# Patient Record
Sex: Male | Born: 2000 | Race: White | Hispanic: No | Marital: Single | Smoking: Never smoker
Health system: Southern US, Community
[De-identification: ages and names within clinical notes are randomized; demographics above are authoritative.]

## PROBLEM LIST (undated history)

## (undated) HISTORY — PX: TOOTH EXTRACTION: SUR596

---
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
## 2020-05-03 ENCOUNTER — Other Ambulatory Visit: Payer: Self-pay | Admitting: Internal Medicine

## 2020-05-04 ENCOUNTER — Other Ambulatory Visit: Payer: Self-pay

## 2020-05-04 ENCOUNTER — Encounter: Payer: Self-pay | Admitting: Internal Medicine

## 2020-05-04 ENCOUNTER — Ambulatory Visit (INDEPENDENT_AMBULATORY_CARE_PROVIDER_SITE_OTHER): Payer: No Typology Code available for payment source | Admitting: Internal Medicine

## 2020-05-04 VITALS — BP 118/62 | HR 107 | Temp 98.2°F | Ht 73.0 in | Wt 151.0 lb

## 2020-05-04 DIAGNOSIS — F321 Major depressive disorder, single episode, moderate: Secondary | ICD-10-CM | POA: Diagnosis not present

## 2020-05-04 DIAGNOSIS — J3089 Other allergic rhinitis: Secondary | ICD-10-CM | POA: Diagnosis not present

## 2020-05-04 DIAGNOSIS — Z9189 Other specified personal risk factors, not elsewhere classified: Secondary | ICD-10-CM

## 2020-05-04 DIAGNOSIS — R259 Unspecified abnormal involuntary movements: Secondary | ICD-10-CM | POA: Diagnosis not present

## 2020-05-04 DIAGNOSIS — R112 Nausea with vomiting, unspecified: Secondary | ICD-10-CM

## 2020-05-04 DIAGNOSIS — F1290 Cannabis use, unspecified, uncomplicated: Secondary | ICD-10-CM

## 2020-05-04 DIAGNOSIS — G43009 Migraine without aura, not intractable, without status migrainosus: Secondary | ICD-10-CM

## 2020-05-04 NOTE — Progress Notes (Signed)
Date:  05/04/2020   Name:  Kent Burke   DOB:  2001-07-09   MRN:  [id number]   Chief Complaint: Establish Care, Anxiety (Wants discuss anxiety and depression. Wants to speak to a councilor along with possibly being prescribed something. Wants referral. ), Muscle Atrophy (Been going on since about 5th grade.- on and off. Has involentary movements of his arms and legs. Wants to know if it could be Paroxsymal Kinesigenic Choreoathetosis. ), and Nausea (X 2 months- waking up feeling nauseous. Food made him feel like vomited. X 3 weeks ago started waking up and vomiting. Nauseous until noon. Thinks after doing some research its coming from smoking marijuana. ) New patient to establish care and discuss anxiety and depression. Previously a patient at [facility]. Immunization History  Administered Date(s) Administered  . Dtap, Unspecified 05/13/2000, 07/20/2000, 09/16/2000, 06/21/2001, 08/20/2004  . Hepatitis B 2001-01[birth year]06, 09/16/2000, 12/16/2000  . HiB (PRP-OMP) 05/13/2000, 07/20/2000, 09/16/2000, 03/22/2001  . IPV 05/13/2000, 07/20/2000, 12/16/2000, 08/20/2004  . MMR 08/20/2000, 06/21/2001  . Meningococcal B, OMV 07/28/2018, 01/07/2019  . Meningococcal Conjugate 07/28/2018  . Pneumococcal-Unspecified 05/13/2000, 07/20/2000, 09/16/2000  . Tdap 04/03/2011, 07/28/2018   Movement disorder - pt relates about 5 years of symptoms that he describes as involuntary movement of his arms or legs, sometimes his neck.  The movements can sometimes be stopped by clenching his fists. They often occur at the onset of intended movement such as walking.  He is very conscious of these and they cause significant embarrassment.  He was seen at student health last year and neurology consult was recommended but never done.  He was at [facility] at the time.  Depression        Associated symptoms include decreased concentration, hopelessness, insomnia, restlessness, decreased interest, appetite change and headaches.   Associated symptoms include no fatigue and no suicidal ideas.  Past medical history includes anxiety.   Anxiety Presents for initial visit. Symptoms include decreased concentration, depressed mood, excessive worry, insomnia, nausea, nervous/anxious behavior and restlessness. Patient reports no chest pain, palpitations, shortness of breath or suicidal ideas. Symptoms occur most days.    Abdominal Cramping This is a new problem. The problem occurs intermittently. The pain is located in the epigastric region. The pain is mild. Associated symptoms include headaches and nausea. Pertinent negatives include no constipation, fever or vomiting. Exacerbated by: he believes it is made worse by his recent increased use of marijuana.   Risk for HIV - pt states that he is in a same sex relationship with the same partner for the past 6 months.  He uses condoms inconsistently.  He has been tested for HIV in the recent past.  He believes that his partner is monogamous.  He wonders if he should consider PrEP.    Review of Systems  Constitutional: Positive for appetite change and unexpected weight change (about 10 lbs). Negative for fatigue and fever.  Respiratory: Negative for cough, chest tightness, shortness of breath and wheezing.   Cardiovascular: Negative for chest pain and palpitations.  Gastrointestinal: Positive for nausea. Negative for blood in stool, constipation and vomiting.  Skin: Negative for rash.  Allergic/Immunologic: Positive for environmental allergies.  Neurological: Positive for tremors (movement disorder) and headaches. Negative for weakness and numbness.  Psychiatric/Behavioral: Positive for decreased concentration, depression and sleep disturbance. Negative for suicidal ideas. The patient is nervous/anxious and has insomnia.     There are no problems to display for this patient.   No Known Allergies  Past Surgical History:  Procedure Laterality Date  . TOOTH EXTRACTION      Childhood- 1st grade    Social History   Tobacco Use  . Smoking status: Never Smoker  . Smokeless tobacco: Never Used  Substance Use Topics  . Alcohol use: Yes    Comment: Rare   . Drug use: Yes    Types: Marijuana     Medication list has been reviewed and updated.  Current Meds  Medication Sig  . loratadine (CLARITIN) 10 MG tablet Take by mouth.  . Multiple Vitamin (MULTIVITAMIN) capsule Take 1 capsule by mouth daily.  . naproxen (NAPROSYN) 500 MG tablet Take 1 tablet (500 mg total) by mouth 2 (two) times daily. At onset of migraine    PHQ 2/9 Scores 05/04/2020  PHQ - 2 Score 4  PHQ- 9 Score 18   GAD 7 : Generalized Anxiety Score 05/04/2020  Nervous, Anxious, on Edge 3  Control/stop worrying 3  Worry too much - different things 3  Trouble relaxing 3  Restless 3  Easily annoyed or irritable 3  Afraid - awful might happen 3  Total GAD 7 Score 21  Anxiety Difficulty Very difficult    BP Readings from Last 3 Encounters:  05/04/20 118/62  09/29/17 125/68 (68 %, Z = 0.48 /  40 %, Z = -0.26)*   *BP percentiles are based on the 2017 AAP Clinical Practice Guideline for boys    Physical Exam Vitals and nursing note reviewed.  Constitutional:      General: He is not in acute distress.    Appearance: Normal appearance. He is well-developed.  HENT:     Head: Normocephalic and atraumatic.  Cardiovascular:     Rate and Rhythm: Normal rate and regular rhythm.     Pulses: Normal pulses.     Heart sounds: No murmur.  Pulmonary:     Effort: Pulmonary effort is normal. No respiratory distress.     Breath sounds: No wheezing or rhonchi.  Abdominal:     General: Abdomen is flat.     Palpations: Abdomen is soft.     Tenderness: There is no abdominal tenderness.  Musculoskeletal:        General: Normal range of motion.     Cervical back: Normal range of motion.     Right lower leg: No edema.     Left lower leg: No edema.  Lymphadenopathy:     Cervical: No cervical  adenopathy.  Skin:    General: Skin is warm and dry.     Capillary Refill: Capillary refill takes less than 2 seconds.     Findings: No rash.  Neurological:     General: No focal deficit present.     Mental Status: He is alert and oriented to person, place, and time.     Sensory: Sensation is intact.     Motor: Motor function is intact.     Coordination: Coordination is intact.     Gait: Gait is intact.     Deep Tendon Reflexes:     Reflex Scores:      Bicep reflexes are 1+ on the right side and 1+ on the left side.      Patellar reflexes are 2+ on the right side and 2+ on the left side. Psychiatric:        Behavior: Behavior normal.        Thought Content: Thought content normal.     Wt Readings from Last 3 Encounters:  05/04/20 151 lb (68.5 kg)  09/29/17 140 lb (63.5 kg) (40 %, Z= -0.25)*   * Growth percentiles are based on CDC (Boys, 2-20 Years) data.    BP 118/62   Pulse (!) 107   Temp 98.2 F (36.8 C) (Temporal)   Ht 6' 1"  (1.854 m)   Wt 151 lb (68.5 kg)   SpO2 97%   BMI 19.92 kg/m   Assessment and Plan: 1. Abnormal involuntary movements None were observed today despite patient's effort to trigger them. His neuro exam is non focal. Will refer to Neurology for further investigation - Ambulatory referral to Neurology  2. Major depressive disorder, single episode, moderate degree ([place]) He is suffering from significant stress and anxiety - largely related to the past year of school difficulty, social isolation, etc Recommend several Mental health providers in the area to pursue counseling and possibly treatment with medication.  3. Cannabinoid hyperemesis syndrome Strongly urged him to cut back on marijuana use  4. Environmental and seasonal allergies Continue claritin as needed  5. Migraine without aura and without status migrainosus, not intractable These are intermittent and respond well to Naproxen.  6. At risk for HIV due to homosexual contact Pt is  counseled regarding routine use of condoms and routine HIV testing. He does not appear to be at high risk at this time - if his situation changes, he would be a candidate for PrEP but would need to be referred to ID.  His is not interested in HIV testing today.   Partially dictated using Editor, commissioning. Any errors are unintentional.  Halina Maidens, MD [facility]  05/04/2020

## 2020-05-04 NOTE — Patient Instructions (Signed)
[facility]: [phone]  [facility]: [phone]  [facility]: [phone]

## 2021-11-06 ENCOUNTER — Ambulatory Visit (INDEPENDENT_AMBULATORY_CARE_PROVIDER_SITE_OTHER): Payer: No Typology Code available for payment source | Admitting: Internal Medicine

## 2021-11-06 ENCOUNTER — Encounter: Payer: Self-pay | Admitting: Internal Medicine

## 2021-11-06 ENCOUNTER — Other Ambulatory Visit: Payer: Self-pay

## 2021-11-06 VITALS — BP 128/78 | HR 76 | Ht 73.0 in | Wt 144.6 lb

## 2021-11-06 DIAGNOSIS — F321 Major depressive disorder, single episode, moderate: Secondary | ICD-10-CM

## 2021-11-06 DIAGNOSIS — M76891 Other specified enthesopathies of right lower limb, excluding foot: Secondary | ICD-10-CM

## 2021-11-06 DIAGNOSIS — Z23 Encounter for immunization: Secondary | ICD-10-CM

## 2021-11-06 DIAGNOSIS — F411 Generalized anxiety disorder: Secondary | ICD-10-CM | POA: Diagnosis not present

## 2021-11-06 NOTE — Progress Notes (Signed)
Date:  11/06/2021   Name:  Kent Burke   DOB:  2001/03/08   MRN:  [id number]   Chief Complaint: Knee Pain  Knee Pain  The incident occurred more than 1 week ago. There was no injury mechanism. The pain is present in the right knee. The pain is at a severity of 4/10. The pain is mild. The pain has been Intermittent since onset. Pertinent negatives include no numbness. Associated symptoms comments: Difficulty extending leg in full, and bending down.  Anxiety Presents for follow-up visit. Symptoms include depressed mood, excessive worry, irritability and nervous/anxious behavior. Symptoms occur most days.   Compliance with medications is 76-100% (prescribed buspar then sertraline by neurology for anxiety felt to be contributing to his muscle spasms.  However, little benefit.).   No results found for: CREATININE, BUN, NA, K, CL, CO2 No results found for: CHOL, HDL, LDLCALC, LDLDIRECT, TRIG, CHOLHDL No results found for: TSH No results found for: HGBA1C No results found for: WBC, HGB, HCT, MCV, PLT No results found for: ALT, AST, GGT, ALKPHOS, BILITOT   Review of Systems  Constitutional:  Positive for irritability. Negative for chills and fever.  Musculoskeletal:  Positive for arthralgias. Negative for gait problem, joint swelling and myalgias.  Skin:  Negative for rash.  Neurological:  Negative for weakness and numbness.  Psychiatric/Behavioral:  The patient is nervous/anxious.    Patient Active Problem List   Diagnosis Date Noted   Abnormal involuntary movements 05/04/2020   Major depressive disorder, single episode, moderate degree (HCC) 05/04/2020   Cannabinoid hyperemesis syndrome 05/04/2020   Environmental and seasonal allergies 05/04/2020   Migraine without aura and without status migrainosus, not intractable 05/04/2020   At risk for HIV due to homosexual contact 05/04/2020    No Known Allergies  Past Surgical History:  Procedure Laterality Date   TOOTH EXTRACTION      Childhood- 1st grade    Social History   Tobacco Use   Smoking status: Never   Smokeless tobacco: Never  Vaping Use   Vaping Use: Former   Substances: Nicotine, Flavoring  Substance Use Topics   Alcohol use: Yes    Comment: Rare    Drug use: Yes    Types: Marijuana     Medication list has been reviewed and updated.  Current Meds  Medication Sig   carbamazepine (TEGRETOL) 200 MG tablet Take 1.5 tablets by mouth daily.   loratadine (CLARITIN) 10 MG tablet Take by mouth.   Multiple Vitamin (MULTIVITAMIN) capsule Take 1 capsule by mouth daily.   naproxen (NAPROSYN) 500 MG tablet Take 1 tablet (500 mg total) by mouth 2 (two) times daily. At onset of migraine   sertraline (ZOLOFT) 50 MG tablet Take 50 mg by mouth daily.    PHQ 2/9 Scores 11/06/2021 05/04/2020  PHQ - 2 Score 5 4  PHQ- 9 Score 13 18    GAD 7 : Generalized Anxiety Score 11/06/2021 05/04/2020  Nervous, Anxious, on Edge 3 3  Control/stop worrying 3 3  Worry too much - different things 3 3  Trouble relaxing 2 3  Restless 1 3  Easily annoyed or irritable 3 3  Afraid - awful might happen 2 3  Total GAD 7 Score 17 21  Anxiety Difficulty Very difficult Very difficult    BP Readings from Last 3 Encounters:  11/06/21 128/78  05/04/20 118/62  09/29/17 125/68 (71 %, Z = 0.55 /  43 %, Z = -0.18)*   *BP percentiles are based on the  2017 AAP Clinical Practice Guideline for boys    Physical Exam Vitals and nursing note reviewed.  Constitutional:      General: He is not in acute distress.    Appearance: He is well-developed.  HENT:     Head: Normocephalic and atraumatic.  Cardiovascular:     Rate and Rhythm: Normal rate and regular rhythm.  Pulmonary:     Effort: Pulmonary effort is normal. No respiratory distress.     Breath sounds: No wheezing or rhonchi.  Musculoskeletal:        General: Normal range of motion.     Cervical back: Normal range of motion.     Right knee: No swelling, effusion, erythema or  bony tenderness. Normal range of motion (mild discomfort anteriorly with extremes of flexion). No tenderness. No MCL laxity or ACL laxity.     Instability Tests: Anterior drawer test negative.     Left knee: Normal.  Lymphadenopathy:     Cervical: No cervical adenopathy.  Skin:    General: Skin is warm and dry.     Findings: No rash.  Neurological:     Mental Status: He is alert and oriented to person, place, and time.  Psychiatric:        Mood and Affect: Mood normal.        Behavior: Behavior normal.    Wt Readings from Last 3 Encounters:  11/06/21 144 lb 9.6 oz (65.6 kg)  05/04/20 151 lb (68.5 kg)  09/29/17 140 lb (63.5 kg) (40 %, Z= -0.25)*   * Growth percentiles are based on CDC (Boys, 2-20 Years) data.    BP 128/78   Pulse 76   Ht 6\' 1"  (1.854 m)   Wt 144 lb 9.6 oz (65.6 kg)   SpO2 97%   BMI 19.08 kg/m   Assessment and Plan: 1. Tendonitis of knee, right Recommend avoiding extremes of ROM such as squatting Aleve 2 tabs bid If worsening or sx change would see SM  2. Generalized anxiety disorder On Sertraline from Neurology He should consider establishing care with a mental health provider for improved sx control He has evidence of depressive sx as well that are not responding well to Sertraline.  3. Need for immunization against influenza - Flu Vaccine QUAD 62mo+IM (Fluarix, Fluzone & Alfiuria Quad PF)   Partially dictated using 5mo. Any errors are unintentional.  Animal nutritionist, MD [facility]  11/06/2021

## 2021-11-06 NOTE — Patient Instructions (Signed)
Aleve 2 tabs twice a day

## 2023-01-22 ENCOUNTER — Encounter: Payer: No Typology Code available for payment source | Admitting: Internal Medicine

## 2023-02-13 ENCOUNTER — Encounter: Payer: Self-pay | Admitting: Internal Medicine

## 2023-02-13 ENCOUNTER — Ambulatory Visit (INDEPENDENT_AMBULATORY_CARE_PROVIDER_SITE_OTHER): Payer: BC Managed Care – PPO | Admitting: Internal Medicine

## 2023-02-13 VITALS — BP 126/76 | HR 81 | Ht 73.0 in | Wt 153.0 lb

## 2023-02-13 DIAGNOSIS — Z0000 Encounter for general adult medical examination without abnormal findings: Secondary | ICD-10-CM | POA: Diagnosis not present

## 2023-02-13 DIAGNOSIS — Z13220 Encounter for screening for lipoid disorders: Secondary | ICD-10-CM

## 2023-02-13 DIAGNOSIS — Z79899 Other long term (current) drug therapy: Secondary | ICD-10-CM | POA: Diagnosis not present

## 2023-02-13 DIAGNOSIS — Z1159 Encounter for screening for other viral diseases: Secondary | ICD-10-CM

## 2023-02-13 DIAGNOSIS — R259 Unspecified abnormal involuntary movements: Secondary | ICD-10-CM

## 2023-02-13 DIAGNOSIS — Z114 Encounter for screening for human immunodeficiency virus [HIV]: Secondary | ICD-10-CM

## 2023-02-13 DIAGNOSIS — F321 Major depressive disorder, single episode, moderate: Secondary | ICD-10-CM

## 2023-02-13 MED ORDER — BUPROPION HCL ER (XL) 150 MG PO TB24: 150.0000 mg | ORAL_TABLET | Freq: Two times a day (BID) | ORAL | 3 refills | Status: DC

## 2023-02-13 NOTE — Assessment & Plan Note (Addendum)
Followed by Neurology On Carbamazepine;  Has also tried zoloft, bupropion, Lexapro

## 2023-02-13 NOTE — Progress Notes (Signed)
Date:  02/13/2023   Name:  Kent Burke   DOB:  2001-03-07   MRN:  [id number]   Chief Complaint: Annual Exam Kent Burke is a 22 y.o. male who presents today for his Complete Annual Exam. He feels poorly. He reports exercising none. He reports he is sleeping poorly. Working at [employer] and [employer]. Sexually active with one male partner exclusively for the past three years.  No STI concerns.  Has not had Guardasil vaccine.  Colonoscopy: none  Immunization History  Administered Date(s) Administered   Dtap, Unspecified 05/13/2000, 07/20/2000, 09/16/2000, 06/21/2001, 08/20/2004   HIB (PRP-OMP) 05/13/2000, 07/20/2000, 09/16/2000, 03/22/2001   Hepatitis B 06/08/00, 09/16/2000, 12/16/2000   IPV 05/13/2000, 07/20/2000, 12/16/2000, 08/20/2004   Influenza,inj,Quad PF,6+ Mos 11/06/2021   MMR 08/20/2000, 06/21/2001   Meningococcal B, OMV 07/28/2018, 01/07/2019   Meningococcal Conjugate 07/28/2018   Pneumococcal-Unspecified 05/13/2000, 07/20/2000, 09/16/2000   Tdap 04/03/2011, 07/28/2018   Health Maintenance Due  Topic Date Due   COVID-19 Vaccine (1) Never done   HPV VACCINES (1 - Male 2-dose series) Never done   Hepatitis C Screening  Never done    No results found for: "PSA1", "PSA"  HPI 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  On Tegretol.  Symptoms are well controlled. Anxiety - also being treated by Neurology.  Previously on Zoloft,  Bupropion and Lexapro.  He stopped all the medications due to lack of benefit and no refills.  He thinks the bupropion might have been helpful at a higher dose.  No results found for: "NA", "K", "CO2", "GLUCOSE", "BUN", "CREATININE", "CALCIUM", "EGFR", "GFRNONAA" No results found for: "CHOL", "HDL", "LDLCALC", "LDLDIRECT", "TRIG", "CHOLHDL" No results found for: "TSH" No results found for: "HGBA1C" No results found for: "WBC", "HGB", "HCT", "MCV", "PLT" No results found for: "ALT", "AST", "GGT", "ALKPHOS", "BILITOT" No results found for:  "25OHVITD2", "25OHVITD3", "VD25OH"   Review of Systems  Constitutional:  Negative for appetite change, chills, diaphoresis, fatigue and unexpected weight change.  HENT:  Negative for hearing loss, tinnitus, trouble swallowing and voice change.   Eyes:  Negative for visual disturbance.  Respiratory:  Negative for choking, shortness of breath and wheezing.   Cardiovascular:  Negative for chest pain, palpitations and leg swelling.  Gastrointestinal:  Negative for abdominal pain, blood in stool, constipation and diarrhea.  Genitourinary:  Negative for difficulty urinating, dysuria and frequency.  Musculoskeletal:  Positive for arthralgias (intermittent knee pain). Negative for back pain and myalgias.  Skin:  Negative for color change and rash.  Neurological:  Negative for dizziness, seizures, syncope and headaches.  Hematological:  Negative for adenopathy.  Psychiatric/Behavioral:  Positive for dysphoric mood and sleep disturbance. The patient is nervous/anxious.     Patient Active Problem List   Diagnosis Date Noted   Abnormal involuntary movements 05/04/2020   Major depressive disorder, single episode, moderate degree ([place]) 05/04/2020   Cannabinoid hyperemesis syndrome 05/04/2020   Environmental and seasonal allergies 05/04/2020   Migraine without aura and without status migrainosus, not intractable 05/04/2020   At risk for HIV due to homosexual contact 05/04/2020    No Known Allergies  Past Surgical History:  Procedure Laterality Date   TOOTH EXTRACTION     Childhood- 1st grade    Social History   Tobacco Use   Smoking status: Never   Smokeless tobacco: Never  Vaping Use   Vaping Use: Former   Substances: Nicotine, Flavoring  Substance Use Topics   Alcohol use: Yes    Comment: Rare  Drug use: Yes    Types: Marijuana     Medication list has been reviewed and updated.  Current Meds  Medication Sig   carbamazepine (TEGRETOL) 200 MG tablet Take 1.5 tablets by mouth  daily.   loratadine (CLARITIN) 10 MG tablet Take by mouth.   Multiple Vitamin (MULTIVITAMIN) capsule Take 1 capsule by mouth daily.   naproxen (NAPROSYN) 500 MG tablet Take 1 tablet (500 mg total) by mouth 2 (two) times daily. At onset of migraine   [DISCONTINUED] buPROPion (WELLBUTRIN XL) 150 MG 24 hr tablet Take 150 mg by mouth daily.   [DISCONTINUED] escitalopram (LEXAPRO) 20 MG tablet Take 20 mg by mouth daily.       02/13/2023    9:20 AM 11/06/2021    9:53 AM 05/04/2020    3:38 PM  GAD 7 : Generalized Anxiety Score  Nervous, Anxious, on Edge 3 3 3  $ Control/stop worrying 3 3 3  $ Worry too much - different things 3 3 3  $ Trouble relaxing 2 2 3  $ Restless 1 1 3  $ Easily annoyed or irritable 3 3 3  $ Afraid - awful might happen 3 2 3  $ Total GAD 7 Score 18 17 21  $ Anxiety Difficulty Somewhat difficult Very difficult Very difficult       02/13/2023    9:20 AM 11/06/2021    9:53 AM 05/04/2020    3:37 PM  Depression screen PHQ 2/9  Decreased Interest 1 3 2  $ Down, Depressed, Hopeless 2 2 2  $ PHQ - 2 Score 3 5 4  $ Altered sleeping 3 2 2  $ Tired, decreased energy 3 2 3  $ Change in appetite 2 1 3  $ Feeling bad or failure about yourself  1 2 1  $ Trouble concentrating 1 1 2  $ Moving slowly or fidgety/restless 1 0 3  Suicidal thoughts 0 0 0  PHQ-9 Score 14 13 18  $ Difficult doing work/chores Somewhat difficult Not difficult at all Very difficult    BP Readings from Last 3 Encounters:  02/13/23 126/76  11/06/21 128/78  05/04/20 118/62    Physical Exam Vitals and nursing note reviewed.  Constitutional:      Appearance: Normal appearance. He is well-developed.  HENT:     Head: Normocephalic.     Right Ear: Tympanic membrane, ear canal and external ear normal.     Left Ear: Tympanic membrane, ear canal and external ear normal.     Nose: Nose normal.  Eyes:     Conjunctiva/sclera: Conjunctivae normal.     Pupils: Pupils are equal, round, and reactive to light.  Neck:     Thyroid: No  thyromegaly.     Vascular: No carotid bruit.  Cardiovascular:     Rate and Rhythm: Normal rate and regular rhythm.     Heart sounds: Normal heart sounds.  Pulmonary:     Effort: Pulmonary effort is normal.     Breath sounds: Normal breath sounds. No wheezing.  Chest:  Breasts:    Right: No mass.     Left: No mass.  Abdominal:     General: Bowel sounds are normal.     Palpations: Abdomen is soft.     Tenderness: There is no abdominal tenderness.  Musculoskeletal:        General: No swelling, tenderness or deformity. Normal range of motion.     Cervical back: Normal range of motion and neck supple.     Right knee: No swelling, deformity, effusion or erythema.     Left knee: No  swelling, deformity, effusion or erythema.     Right lower leg: No edema.     Left lower leg: No edema.  Lymphadenopathy:     Cervical: No cervical adenopathy.  Skin:    General: Skin is warm and dry.     Capillary Refill: Capillary refill takes less than 2 seconds.  Neurological:     General: No focal deficit present.     Mental Status: He is alert and oriented to person, place, and time.     Deep Tendon Reflexes: Reflexes are normal and symmetric.  Psychiatric:        Attention and Perception: Attention normal.        Mood and Affect: Mood normal.        Thought Content: Thought content normal.     Wt Readings from Last 3 Encounters:  02/13/23 153 lb (69.4 kg)  11/06/21 144 lb 9.6 oz (65.6 kg)  05/04/20 151 lb (68.5 kg)    BP 126/76   Pulse 81   Ht 6' 1"$  (1.854 m)   Wt 153 lb (69.4 kg)   SpO2 96%   BMI 20.19 kg/m   Assessment and Plan: Problem List Items Addressed This Visit       Other   Abnormal involuntary movements    Followed by Neurology On Carbamazepine;  Has also tried zoloft, bupropion, Lexapro      Major depressive disorder, single episode, moderate degree (H[facility])    Previously on Lexapro and Bupropion but stopped last year Recommend findings a Psych or therapist - list  given Start Bupropion 150 mg - taper up to bid.       Relevant Medications   buPROPion (WELLBUTRIN XL) 150 MG 24 hr tablet   Other Visit Diagnoses     Annual physical exam    -  Primary   we discussed good sleep hygiene possible Gardasil vaccine More regular exercise outside of work   Relevant Orders   CBC with Differential/Platelet   Comprehensive metabolic panel   HIV Antibody (routine testing w rflx)   Lipid panel   Encounter for screening for HIV       Relevant Orders   HIV Antibody (routine testing w rflx)   Screening for lipid disorders       Relevant Orders   Lipid panel   Need for hepatitis C screening test       Relevant Orders   Hepatitis C antibody   Long term current use of therapeutic drug       Relevant Orders   CBC with Differential/Platelet   Comprehensive metabolic panel   Carbamazepine Level (Tegretol), total        Partially dictated using Editor, commissioning. Any errors are unintentional.  Halina Maidens, MD [facility]  02/13/2023

## 2023-02-13 NOTE — Assessment & Plan Note (Addendum)
Previously on Lexapro and Bupropion but stopped last year Recommend findings a Psych or therapist - list given Start Bupropion 150 mg - taper up to bid.

## 2023-02-14 LAB — CBC WITH DIFFERENTIAL/PLATELET
Basophils Absolute: 0.1 10*3/uL (ref 0.0–0.2)
Basos: 1 %
EOS (ABSOLUTE): 0.5 10*3/uL — ABNORMAL HIGH (ref 0.0–0.4)
Eos: 8 %
Hematocrit: 43.5 % (ref 37.5–51.0)
Hemoglobin: 14.5 g/dL (ref 13.0–17.7)
Immature Grans (Abs): 0 10*3/uL (ref 0.0–0.1)
Immature Granulocytes: 0 %
Lymphocytes Absolute: 2.2 10*3/uL (ref 0.7–3.1)
Lymphs: 36 %
MCH: 29.2 pg (ref 26.6–33.0)
MCHC: 33.3 g/dL (ref 31.5–35.7)
MCV: 88 fL (ref 79–97)
Monocytes Absolute: 0.4 10*3/uL (ref 0.1–0.9)
Monocytes: 7 %
Neutrophils Absolute: 2.9 10*3/uL (ref 1.4–7.0)
Neutrophils: 48 %
Platelets: 250 10*3/uL (ref 150–450)
RBC: 4.97 x10E6/uL (ref 4.14–5.80)
RDW: 12.4 % (ref 11.6–15.4)
WBC: 6 10*3/uL (ref 3.4–10.8)

## 2023-02-14 LAB — COMPREHENSIVE METABOLIC PANEL
ALT: 12 IU/L (ref 0–44)
AST: 19 IU/L (ref 0–40)
Albumin/Globulin Ratio: 1.9 (ref 1.2–2.2)
Albumin: 5 g/dL (ref 4.3–5.2)
Alkaline Phosphatase: 58 IU/L (ref 44–121)
BUN/Creatinine Ratio: 13 (ref 9–20)
BUN: 10 mg/dL (ref 6–20)
Bilirubin Total: 0.3 mg/dL (ref 0.0–1.2)
CO2: 23 mmol/L (ref 20–29)
Calcium: 10.1 mg/dL (ref 8.7–10.2)
Chloride: 102 mmol/L (ref 96–106)
Creatinine, Ser: 0.76 mg/dL (ref 0.76–1.27)
Globulin, Total: 2.6 g/dL (ref 1.5–4.5)
Glucose: 92 mg/dL (ref 70–99)
Potassium: 4.2 mmol/L (ref 3.5–5.2)
Sodium: 140 mmol/L (ref 134–144)
Total Protein: 7.6 g/dL (ref 6.0–8.5)
eGFR: 130 mL/min/{1.73_m2} (ref >59)

## 2023-02-14 LAB — LIPID PANEL
Chol/HDL Ratio: 2.7 ratio (ref 0.0–5.0)
Cholesterol, Total: 143 mg/dL (ref 100–199)
HDL: 53 mg/dL (ref >39)
LDL Chol Calc (NIH): 78 mg/dL (ref 0–99)
Triglycerides: 54 mg/dL (ref 0–149)
VLDL Cholesterol Cal: 12 mg/dL (ref 5–40)

## 2023-02-14 LAB — HIV ANTIBODY (ROUTINE TESTING W REFLEX): HIV Screen 4th Generation wRfx: NONREACTIVE

## 2023-02-14 LAB — HEPATITIS C ANTIBODY: Hep C Virus Ab: NONREACTIVE

## 2023-02-14 LAB — CARBAMAZEPINE LEVEL, TOTAL: Carbamazepine (Tegretol), S: 4.4 ug/mL (ref 4.0–12.0)

## 2023-02-16 NOTE — Progress Notes (Signed)
PC to pt discussed labs, voiced understanding.

## 2023-03-05 DIAGNOSIS — R253 Fasciculation: Secondary | ICD-10-CM | POA: Diagnosis not present

## 2023-03-05 DIAGNOSIS — F411 Generalized anxiety disorder: Secondary | ICD-10-CM | POA: Diagnosis not present

## 2023-03-05 DIAGNOSIS — M62838 Other muscle spasm: Secondary | ICD-10-CM | POA: Diagnosis not present

## 2023-03-05 DIAGNOSIS — R259 Unspecified abnormal involuntary movements: Secondary | ICD-10-CM | POA: Diagnosis not present

## 2023-06-29 ENCOUNTER — Other Ambulatory Visit: Payer: Self-pay | Admitting: Internal Medicine

## 2023-06-29 DIAGNOSIS — F321 Major depressive disorder, single episode, moderate: Secondary | ICD-10-CM

## 2023-08-02 ENCOUNTER — Ambulatory Visit
Admission: EM | Admit: 2023-08-02 | Discharge: 2023-08-02 | Disposition: A | Discharge status: Home / Self Care | Payer: BC Managed Care – PPO | Attending: Physician Assistant | Admitting: Physician Assistant

## 2023-08-02 ENCOUNTER — Ambulatory Visit (INDEPENDENT_AMBULATORY_CARE_PROVIDER_SITE_OTHER): Payer: BC Managed Care – PPO

## 2023-08-02 DIAGNOSIS — S93601A Unspecified sprain of right foot, initial encounter: Secondary | ICD-10-CM

## 2023-08-02 DIAGNOSIS — M79671 Pain in right foot: Secondary | ICD-10-CM

## 2023-08-02 NOTE — ED Triage Notes (Signed)
Pt here with C/O right foot pain, 2 weeks ago rolled foot hit top of foot, no swelling

## 2023-08-02 NOTE — ED Provider Notes (Signed)
[facility]    CSN: [id number] Arrival date & time: 08/02/23  0957      History   Chief Complaint Chief Complaint  Patient presents with   Foot Pain    HPI Kent Burke is a 22 y.o. male presenting for right dorsal foot pain x 2 to 3 weeks.  Patient reports he was wearing platform crocs a couple weeks ago and somehow twisted his foot/ankle.  He denies a fall but says that he caught himself as he was trying to fall.  He reports he did not think much of it but he is continue to have discomfort along the top of his foot since.  He denies any associated swelling.  He says he ices the area and has taken ibuprofen when he remembers it.  He says this has helped and symptoms have gotten better but they persist so he thought he should get checked out.  He reports increased pain with plantarflexion.  Pain gets a little better when he is walking.  No numbness or weakness.  No other complaints.  HPI  No past medical history on file.  Patient Active Problem List   Diagnosis Date Noted   Abnormal involuntary movements 05/04/2020   Major depressive disorder, single episode, moderate degree (HCC) 05/04/2020   Cannabinoid hyperemesis syndrome 05/04/2020   Environmental and seasonal allergies 05/04/2020   Migraine without aura and without status migrainosus, not intractable 05/04/2020   At risk for HIV due to homosexual contact 05/04/2020    Past Surgical History:  Procedure Laterality Date   TOOTH EXTRACTION     Childhood- 1st grade       Home Medications    Prior to Admission medications   Medication Sig Start Date End Date Taking? Authorizing Provider  buPROPion (WELLBUTRIN XL) 150 MG 24 hr tablet TAKE 1 TABLET(150 MG) BY MOUTH IN THE MORNING AND AT BEDTIME 06/30/23  Yes Reubin Milan, MD  carbamazepine (TEGRETOL) 200 MG tablet Take 1.5 tablets by mouth daily. 10/22/21  Yes [provider]  loratadine (CLARITIN) 10 MG tablet Take by mouth.   Yes [provider]  Multiple Vitamin (MULTIVITAMIN) capsule Take 1 capsule by mouth daily.   Yes [provider]  naproxen (NAPROSYN) 500 MG tablet Take 1 tablet (500 mg total) by mouth 2 (two) times daily. At onset of migraine 09/29/17   Lutricia Feil, PA-C    Family History Family History  Problem Relation Age of Onset   Migraines Mother    Healthy Father    Breast cancer Paternal Grandmother     Social History Social History   Tobacco Use   Smoking status: Never   Smokeless tobacco: Never  Vaping Use   Vaping status: Former   Substances: Nicotine, Flavoring  Substance Use Topics   Alcohol use: Yes    Comment: Rare    Drug use: Yes    Types: Marijuana     Allergies   Patient has no known allergies.   Review of Systems Review of Systems  Musculoskeletal:  Positive for arthralgias. Negative for gait problem and joint swelling.  Skin:  Negative for color change and wound.  Neurological:  Negative for weakness and numbness.     Physical Exam Triage Vital Signs ED Triage Vitals  Encounter Vitals Group     BP 08/02/23 1011 118/70     Systolic BP Percentile --      Diastolic BP Percentile --      Pulse Rate 08/02/23 1011  80     Resp 08/02/23 1011 16     Temp 08/02/23 1011 98.9 F (37.2 C)     Temp Source 08/02/23 1011 Oral     SpO2 08/02/23 1011 100 %     Weight 08/02/23 1009 145 lb (65.8 kg)     Height 08/02/23 1009 6\' 1"  (1.854 m)     Head Circumference --      Peak Flow --      Pain Score 08/02/23 1009 7     Pain Loc --      Pain Education --      Exclude from Growth Chart --    No data found.  Updated Vital Signs BP 118/70 (BP Location: Left Arm)   Pulse 80   Temp 98.9 F (37.2 C) (Oral)   Resp 16   Ht 6\' 1"  (1.854 m)   Wt 145 lb (65.8 kg)   SpO2 100%   BMI 19.13 kg/m     Physical Exam Vitals and nursing note reviewed.  Constitutional:      General: He is not in acute distress.    Appearance: He is well-developed.  HENT:      Head: Normocephalic and atraumatic.  Eyes:     General: No scleral icterus.    Conjunctiva/sclera: Conjunctivae normal.  Cardiovascular:     Rate and Rhythm: Normal rate and regular rhythm.     Pulses: Normal pulses.  Pulmonary:     Effort: Pulmonary effort is normal. No respiratory distress.  Musculoskeletal:     Cervical back: Neck supple.     Right ankle: No swelling. Tenderness present. No lateral malleolus or medial malleolus tenderness. Normal range of motion. Normal pulse.     Right foot: Normal range of motion. Tenderness (TTP 4th and 5th metatarsals, talus and navicular) present. No swelling. Normal pulse.  Skin:    General: Skin is warm and dry.     Capillary Refill: Capillary refill takes less than 2 seconds.  Neurological:     General: No focal deficit present.     Mental Status: He is alert. Mental status is at baseline.     Motor: No weakness.     Gait: Gait normal.  Psychiatric:        Mood and Affect: Mood normal.        Behavior: Behavior normal.      UC Treatments / Results  Labs (all labs ordered are listed, but only abnormal results are displayed) Labs Reviewed - No data to display  EKG   Radiology DG Foot Complete Right  Result Date: 08/02/2023 CLINICAL DATA:  Right foot pain. EXAM: RIGHT FOOT COMPLETE - 3+ VIEW COMPARISON:  None Available. FINDINGS: There is no evidence of fracture or dislocation. There is no evidence of arthropathy or other focal bone abnormality. Soft tissues are unremarkable. IMPRESSION: Negative. Electronically Signed   By: Kennith Center M.D.   On: 08/02/2023 11:10    Procedures Procedures (including critical care time)  Medications Ordered in UC Medications - No data to display  Initial Impression / Assessment and Plan / UC Course  I have reviewed the triage vital signs and the nursing notes.  Pertinent labs & imaging results that were available during my care of the patient were reviewed by me and considered in my medical  decision making (see chart for details).   22 year old male presents for right foot pain after twisting injury 2 to 3 weeks ago while wearing platform crocs.  X-ray of foot  obtained today.  Negative.  Right foot sprain.  Reviewed supportive care with RICE guidelines, NSAIDs.  Advised to be little more consistent with anti-inflammatory medication.  Advise no improvement in 2 weeks with follow-up with PCP or orthopedics.   Final Clinical Impressions(s) / UC Diagnoses   Final diagnoses:  Sprain of right foot, initial encounter  Foot pain, right     Discharge Instructions      SPRAIN: X-ray is negative. Stressed avoiding painful activities . Reviewed RICE guidelines. Use medications as directed, including NSAIDs. If no NSAIDs have been prescribed for you today, you may take Aleve or Motrin over the counter. May use Tylenol in between doses of NSAIDs.  If no improvement in the next 1-2 weeks, f/u with PCP or orthopedics.     ED Prescriptions   None    PDMP not reviewed this encounter.   Shirlee Latch, PA-C 08/02/23 1119

## 2023-08-02 NOTE — Discharge Instructions (Signed)
SPRAIN: X-ray is negative. Stressed avoiding painful activities . Reviewed RICE guidelines. Use medications as directed, including NSAIDs. If no NSAIDs have been prescribed for you today, you may take Aleve or Motrin over the counter. May use Tylenol in between doses of NSAIDs.  If no improvement in the next 1-2 weeks, f/u with PCP or orthopedics.

## 2023-09-30 DIAGNOSIS — M62838 Other muscle spasm: Secondary | ICD-10-CM | POA: Diagnosis not present

## 2023-09-30 DIAGNOSIS — F411 Generalized anxiety disorder: Secondary | ICD-10-CM | POA: Diagnosis not present

## 2023-09-30 DIAGNOSIS — R259 Unspecified abnormal involuntary movements: Secondary | ICD-10-CM | POA: Diagnosis not present

## 2023-09-30 DIAGNOSIS — R253 Fasciculation: Secondary | ICD-10-CM | POA: Diagnosis not present

## 2024-02-16 ENCOUNTER — Encounter: Payer: Self-pay | Admitting: Internal Medicine

## 2024-03-09 DIAGNOSIS — F4323 Adjustment disorder with mixed anxiety and depressed mood: Secondary | ICD-10-CM | POA: Diagnosis not present

## 2024-03-09 DIAGNOSIS — R259 Unspecified abnormal involuntary movements: Secondary | ICD-10-CM | POA: Diagnosis not present

## 2024-03-09 DIAGNOSIS — R253 Fasciculation: Secondary | ICD-10-CM | POA: Diagnosis not present

## 2024-05-18 ENCOUNTER — Encounter: Payer: BC Managed Care – PPO | Admitting: Internal Medicine

## 2024-09-24 ENCOUNTER — Ambulatory Visit
Admission: EM | Admit: 2024-09-24 | Discharge: 2024-09-24 | Disposition: A | Discharge status: Home / Self Care | Attending: Physician Assistant | Admitting: Physician Assistant

## 2024-09-24 DIAGNOSIS — M25511 Pain in right shoulder: Secondary | ICD-10-CM

## 2024-09-24 DIAGNOSIS — M549 Dorsalgia, unspecified: Secondary | ICD-10-CM

## 2024-09-24 DIAGNOSIS — M25512 Pain in left shoulder: Secondary | ICD-10-CM

## 2024-09-24 DIAGNOSIS — M542 Cervicalgia: Secondary | ICD-10-CM

## 2024-09-24 MED ORDER — BACLOFEN 10 MG PO TABS: 10.0000 mg | ORAL_TABLET | Freq: Three times a day (TID) | ORAL | 0 refills | Status: DC | PRN

## 2024-09-24 MED ORDER — NAPROXEN 500 MG PO TABS: 500.0000 mg | ORAL_TABLET | Freq: Two times a day (BID) | ORAL | 0 refills | Status: DC | PRN

## 2024-09-24 NOTE — ED Triage Notes (Signed)
 Pt c/o right side back pain x3days  Pt states that he has a bad habit of sleeping in bad positions, and is now having ongoing upper back pain that moves across the shoulder and to the right elbow.  Pt states that he went to get a massage, and it was overdone  Pt states that he has a new bed that is soft, and is a side sleeper.

## 2024-09-24 NOTE — ED Provider Notes (Signed)
 [facility]    CSN: [id number] Arrival date & time: 09/24/24  0806      History   Chief Complaint Chief Complaint  Patient presents with   Back Pain    HPI Hisashi Amadon is a 23 y.o. male presenting for bilateral upper back pain, thoracic spinal pain, bilateral shoulder pain and neck pain for the past 3 days.  Patient says it feels like he has pain all the way down to both of his elbows.  Denies weakness or numbness.  No injuries.  Patient states that he has to lift, reach and bend a lot at his job.  He states that his partner gave him a massage last night and he thinks it was too rough.  He has been taking ibuprofen as needed but says it has not really helped.  Patient also reports that he sleeps on his sides and thinks that has made things worse.  No other complaints.  HPI  History reviewed. No pertinent past medical history.  Patient Active Problem List   Diagnosis Date Noted   Abnormal involuntary movements 05/04/2020   Major depressive disorder, single episode, moderate degree (HCC) 05/04/2020   Cannabinoid hyperemesis syndrome 05/04/2020   Environmental and seasonal allergies 05/04/2020   Migraine without aura and without status migrainosus, not intractable 05/04/2020   At risk for HIV due to homosexual contact 05/04/2020    Past Surgical History:  Procedure Laterality Date   TOOTH EXTRACTION     Childhood- 1st grade       Home Medications    Prior to Admission medications   Medication Sig Start Date End Date Taking? Authorizing Provider  baclofen (LIORESAL) 10 MG tablet Take 1 tablet (10 mg total) by mouth 3 (three) times daily as needed for muscle spasms. 09/24/24  Yes Arvis Jolan NOVAK, PA-C  carbamazepine  (TEGRETOL ) 200 MG tablet Take 1.5 tablets by mouth daily. 10/22/21  Yes [provider]  loratadine (CLARITIN) 10 MG tablet Take by mouth.   Yes [provider]  Multiple Vitamin (MULTIVITAMIN) capsule Take 1 capsule by mouth  daily.   Yes [provider]  naproxen  (NAPROSYN ) 500 MG tablet Take 1 tablet (500 mg total) by mouth 2 (two) times daily as needed for moderate pain (pain score 4-6). 09/24/24  Yes Arvis Jolan B, PA-C  buPROPion  (WELLBUTRIN  XL) 150 MG 24 hr tablet TAKE 1 TABLET(150 MG) BY MOUTH IN THE MORNING AND AT BEDTIME 06/30/23   Justus Leita DEL, MD    Family History Family History  Problem Relation Age of Onset   Migraines Mother    Healthy Father    Breast cancer Paternal Grandmother     Social History Social History   Tobacco Use   Smoking status: Never   Smokeless tobacco: Never  Vaping Use   Vaping status: Former   Substances: Nicotine, Flavoring  Substance Use Topics   Alcohol use: Yes    Comment: Rare    Drug use: Yes    Types: Marijuana     Allergies   Patient has no known allergies.   Review of Systems Review of Systems  Musculoskeletal:  Positive for arthralgias, back pain, myalgias and neck pain. Negative for gait problem and joint swelling.  Skin:  Negative for color change, rash and wound.  Neurological:  Negative for weakness, numbness and headaches.     Physical Exam Triage Vital Signs ED Triage Vitals  Encounter Vitals Group     BP      Girls Systolic  BP Percentile      Girls Diastolic BP Percentile      Boys Systolic BP Percentile      Boys Diastolic BP Percentile      Pulse      Resp      Temp      Temp src      SpO2      Weight      Height      Head Circumference      Peak Flow      Pain Score      Pain Loc      Pain Education      Exclude from Growth Chart    No data found.  Updated Vital Signs BP 126/74 (BP Location: Left Arm)   Pulse 74   Temp 98.7 F (37.1 C) (Oral)   Resp 16   Wt 158 lb 9.6 oz (71.9 kg)   SpO2 97%   BMI 20.92 kg/m   Physical Exam Vitals and nursing note reviewed.  Constitutional:      General: He is not in acute distress.    Appearance: Normal appearance. He is well-developed. He is not  ill-appearing.  HENT:     Head: Normocephalic and atraumatic.  Eyes:     General: No scleral icterus.    Conjunctiva/sclera: Conjunctivae normal.  Cardiovascular:     Rate and Rhythm: Normal rate and regular rhythm.  Pulmonary:     Effort: Pulmonary effort is normal. No respiratory distress.     Breath sounds: Normal breath sounds.  Musculoskeletal:     Right shoulder: Tenderness (generalized) present. Normal range of motion.     Left shoulder: Tenderness (generalized) present. Normal range of motion.     Right elbow: Normal.     Left elbow: Normal.     Cervical back: Normal range of motion and neck supple. No tenderness. Pain with movement present. No spinous process tenderness or muscular tenderness.     Thoracic back: Normal range of motion.       Back:     Comments: TTP bilateral traps, along T-spine minimally, bilateral parathoracic muscles  Skin:    General: Skin is warm and dry.     Capillary Refill: Capillary refill takes less than 2 seconds.  Neurological:     General: No focal deficit present.     Mental Status: He is alert. Mental status is at baseline.     Motor: No weakness.     Gait: Gait normal.  Psychiatric:        Mood and Affect: Mood normal.        Behavior: Behavior normal.      UC Treatments / Results  Labs (all labs ordered are listed, but only abnormal results are displayed) Labs Reviewed - No data to display  EKG   Radiology No results found.  Procedures Procedures (including critical care time)  Medications Ordered in UC Medications - No data to display  Initial Impression / Assessment and Plan / UC Course  I have reviewed the triage vital signs and the nursing notes.  Pertinent labs & imaging results that were available during my care of the patient were reviewed by me and considered in my medical decision making (see chart for details).   23 year old male presents for pain of thoracic back, bilateral traps, bilateral shoulders and  neck for the past 3 days.  Symptoms worse after having massage by his partner.  Has been taking ibuprofen without relief.  Denies any  weakness or numbness of extremities.  Exam significant for generalized muscle tenderness of shoulders, traps and upper back.  Full range of motion.  Will treat at this time with naproxen  and baclofen.  Discussed stretches and reviewed attempting to open book exercise to help stretch.  He asked for a work note for today which was provided.  Reviewed return and ER precautions and advised PCP follow-up if symptoms ongoing.   Final Clinical Impressions(s) / UC Diagnoses   Final diagnoses:  Acute upper back pain  Acute pain of both shoulders  Neck pain     Discharge Instructions      -Try open book exercise (3 sets of 10-15 reps) 1-2 x daily  NECK PAIN: Stressed avoiding painful activities. This can exacerbate your symptoms and make them worse.  May apply heat to the areas of pain for some relief. Use medications as directed. Be aware of which medications make you drowsy and do not drive or operate any kind of heavy machinery while using the medication (ie pain medications or muscle relaxers). F/U with PCP for reexamination or return sooner if condition worsens or does not begin to improve over the next few days.   NECK PAIN RED FLAGS: If symptoms get worse than they are right now, you should come back sooner for re-evaluation. If you have increased numbness/ tingling or notice that the numbness/tingling is affecting the legs or saddle region, go to ER. If you ever lose continence go to ER.      BACK PAIN: Stressed avoiding painful activities . RICE (REST, ICE, COMPRESSION, ELEVATION) guidelines reviewed. May alternate ice and heat. Consider use of muscle rubs, Salonpas patches, etc. Use medications as directed including muscle relaxers if prescribed. Take anti-inflammatory medications as prescribed or OTC NSAIDs/Tylenol.  F/u with PCP in 7-10 days for reexamination,  and please feel free to call or return to the urgent care at any time for any questions or concerns you may have and we will be happy to help you!   BACK PAIN RED FLAGS: If the back pain acutely worsens or there are any red flag symptoms such as numbness/tingling, leg weakness, saddle anesthesia, or loss of bowel/bladder control, go immediately to the ER. Follow up with us  as scheduled or sooner if the pain does not begin to resolve or if it worsens before the follow up       ED Prescriptions     Medication Sig Dispense Auth. Provider   naproxen  (NAPROSYN ) 500 MG tablet Take 1 tablet (500 mg total) by mouth 2 (two) times daily as needed for moderate pain (pain score 4-6). 30 tablet Arvis Huxley B, PA-C   baclofen (LIORESAL) 10 MG tablet Take 1 tablet (10 mg total) by mouth 3 (three) times daily as needed for muscle spasms. 30 each Arvis Huxley KATHEE DEVONNA      PDMP not reviewed this encounter.   Arvis Huxley KATHEE, PA-C 09/24/24 [phone]

## 2024-09-24 NOTE — Discharge Instructions (Signed)
-  Try open book exercise (3 sets of 10-15 reps) 1-2 x daily  NECK PAIN: Stressed avoiding painful activities. This can exacerbate your symptoms and make them worse.  May apply heat to the areas of pain for some relief. Use medications as directed. Be aware of which medications make you drowsy and do not drive or operate any kind of heavy machinery while using the medication (ie pain medications or muscle relaxers). F/U with PCP for reexamination or return sooner if condition worsens or does not begin to improve over the next few days.   NECK PAIN RED FLAGS: If symptoms get worse than they are right now, you should come back sooner for re-evaluation. If you have increased numbness/ tingling or notice that the numbness/tingling is affecting the legs or saddle region, go to ER. If you ever lose continence go to ER.      BACK PAIN: Stressed avoiding painful activities . RICE (REST, ICE, COMPRESSION, ELEVATION) guidelines reviewed. May alternate ice and heat. Consider use of muscle rubs, Salonpas patches, etc. Use medications as directed including muscle relaxers if prescribed. Take anti-inflammatory medications as prescribed or OTC NSAIDs/Tylenol.  F/u with PCP in 7-10 days for reexamination, and please feel free to call or return to the urgent care at any time for any questions or concerns you may have and we will be happy to help you!   BACK PAIN RED FLAGS: If the back pain acutely worsens or there are any red flag symptoms such as numbness/tingling, leg weakness, saddle anesthesia, or loss of bowel/bladder control, go immediately to the ER. Follow up with us  as scheduled or sooner if the pain does not begin to resolve or if it worsens before the follow up

## 2024-09-25 ENCOUNTER — Ambulatory Visit

## 2024-10-19 ENCOUNTER — Ambulatory Visit
Admission: EM | Admit: 2024-10-19 | Discharge: 2024-10-19 | Disposition: A | Discharge status: Home / Self Care | Attending: Emergency Medicine | Admitting: Emergency Medicine

## 2024-10-19 DIAGNOSIS — M542 Cervicalgia: Secondary | ICD-10-CM

## 2024-10-19 DIAGNOSIS — M546 Pain in thoracic spine: Secondary | ICD-10-CM | POA: Diagnosis not present

## 2024-10-19 MED ORDER — DEXAMETHASONE SOD PHOSPHATE PF 10 MG/ML IJ SOLN
10.0000 mg | Freq: Once | INTRAMUSCULAR | Status: AC
  Administered 2024-10-19: 10 mg via INTRAMUSCULAR

## 2024-10-19 MED ORDER — BACLOFEN 10 MG PO TABS: 10.0000 mg | ORAL_TABLET | Freq: Three times a day (TID) | ORAL | 0 refills | Status: AC | PRN

## 2024-10-19 MED ORDER — PREDNISONE 10 MG (21) PO TBPK: ORAL_TABLET | ORAL | 0 refills | Status: DC

## 2024-10-19 NOTE — ED Provider Notes (Addendum)
 [facility]    CSN: [id number] Arrival date & time: 10/19/24  9187      History   Chief Complaint Chief Complaint  Patient presents with   Back Pain    HPI Kent Burke is a 23 y.o. male.   HPI  23 year old male with past medical history significant for MDD, abnormal involuntary movements, and migraine headaches without aura presents for evaluation of worsening pain in his neck and upper back with radiation to his right elbow and numbness in his right thumb and index finger.  He has been doing home physical therapy and using the naproxen  and Tylenol that he was prescribed along with his muscle relaxer without improvement of symptoms.  Massage therapy has made the pain worse.  He did have dry needling performed by a chiropractor 3 days ago without improvement of symptoms.  History reviewed. No pertinent past medical history.  Patient Active Problem List   Diagnosis Date Noted   Abnormal involuntary movements 05/04/2020   Major depressive disorder, single episode, moderate degree (HCC) 05/04/2020   Cannabinoid hyperemesis syndrome 05/04/2020   Environmental and seasonal allergies 05/04/2020   Migraine without aura and without status migrainosus, not intractable 05/04/2020   At risk for HIV due to homosexual contact 05/04/2020    Past Surgical History:  Procedure Laterality Date   TOOTH EXTRACTION     Childhood- 1st grade       Home Medications    Prior to Admission medications   Medication Sig Start Date End Date Taking? Authorizing Provider  carbamazepine  (TEGRETOL ) 200 MG tablet Take 1.5 tablets by mouth daily. 10/22/21  Yes [provider]  predniSONE (STERAPRED UNI-PAK 21 TAB) 10 MG (21) TBPK tablet Take 6 tablets on day 1, 5 tablets day 2, 4 tablets day 3, 3 tablets day 4, 2 tablets day 5, 1 tablet day 6 10/19/24  Yes Bernardino Ditch, NP  baclofen (LIORESAL) 10 MG tablet Take 1 tablet (10 mg total) by mouth 3 (three) times daily as needed for  muscle spasms. 10/19/24   Bernardino Ditch, NP  buPROPion  (WELLBUTRIN  XL) 150 MG 24 hr tablet TAKE 1 TABLET(150 MG) BY MOUTH IN THE MORNING AND AT BEDTIME 06/30/23   Berglund, Laura H, MD  loratadine (CLARITIN) 10 MG tablet Take by mouth.    [provider]  Multiple Vitamin (MULTIVITAMIN) capsule Take 1 capsule by mouth daily.    [provider]    Family History Family History  Problem Relation Age of Onset   Migraines Mother    Healthy Father    Breast cancer Paternal Grandmother     Social History Social History   Tobacco Use   Smoking status: Never   Smokeless tobacco: Never  Vaping Use   Vaping status: Former   Substances: Nicotine, Flavoring  Substance Use Topics   Alcohol use: Yes    Comment: Rare    Drug use: Yes    Types: Marijuana     Allergies   Patient has no known allergies.   Review of Systems Review of Systems  Musculoskeletal:  Positive for back pain and neck pain.  Neurological:  Positive for numbness. Negative for weakness.     Physical Exam Triage Vital Signs ED Triage Vitals  Encounter Vitals Group     BP 10/19/24 0829 134/76     Girls Systolic BP Percentile --      Girls Diastolic BP Percentile --      Boys Systolic BP Percentile --  Boys Diastolic BP Percentile --      Pulse Rate 10/19/24 0829 63     Resp 10/19/24 0829 18     Temp 10/19/24 0829 98.7 F (37.1 C)     Temp Source 10/19/24 0829 Oral     SpO2 10/19/24 0829 98 %     Weight 10/19/24 0828 155 lb (70.3 kg)     Height --      Head Circumference --      Peak Flow --      Pain Score 10/19/24 0828 7     Pain Loc --      Pain Education --      Exclude from Growth Chart --    No data found.  Updated Vital Signs BP 134/76 (BP Location: Right Arm)   Pulse 63   Temp 98.7 F (37.1 C) (Oral)   Resp 18   Wt 155 lb (70.3 kg)   SpO2 98%   BMI 20.45 kg/m   Visual Acuity Right Eye Distance:   Left Eye Distance:   Bilateral Distance:    Right Eye Near:    Left Eye Near:    Bilateral Near:     Physical Exam Vitals and nursing note reviewed.  Constitutional:      Appearance: Normal appearance. He is not ill-appearing.  HENT:     Head: Normocephalic and atraumatic.  Musculoskeletal:        General: Tenderness present. No swelling or signs of injury.  Skin:    General: Skin is warm and dry.     Capillary Refill: Capillary refill takes less than 2 seconds.  Neurological:     General: No focal deficit present.     Mental Status: He is alert and oriented to person, place, and time.      UC Treatments / Results  Labs (all labs ordered are listed, but only abnormal results are displayed) Labs Reviewed - No data to display  EKG   Radiology No results found.  Procedures Procedures (including critical care time)  Medications Ordered in UC Medications - No data to display  Initial Impression / Assessment and Plan / UC Course  I have reviewed the triage vital signs and the nursing notes.  Pertinent labs & imaging results that were available during my care of the patient were reviewed by me and considered in my medical decision making (see chart for details).   Patient is a nontoxic-appearing 23 year old male presenting for evaluation of worsening neck and back pain as outlined in HPI above.  In the exam room he does not appear to be in any acute distress.  Bilateral grips and upper extremity strength are 5/5.  He has no midline spinous process tenderness or step-off in the cervical or thoracic spine.  He does have some mild muscle tension in the bilateral paraspinous region in both the cervical and thoracic region.  He denies any history of injury leading to this pain or any previous injury to his neck or back.  He was advised by the chiropractor to return to our urgent care for a shot of steroids.  I have advised the patient that we can do a trial of steroids but if he does not have improvement of his symptoms he will need to see the  spine center as he will need imaging of his cervical or thoracic spine to evaluate for any nerve impingement which could be contributing to his pain.  I have ordered 10 mg of IM Decadron to be  given here in clinic and discharge him home on a 6-day prednisone taper.  I have also given him the contact information for [facility] in [place] and symptoms not improve or should they worsen.  Work note limiting to a 5 pound weight restriction provided at discharge.   Final Clinical Impressions(s) / UC Diagnoses   Final diagnoses:  Neck pain  Acute bilateral thoracic back pain     Discharge Instructions      Stop taking the naproxen  and begin taking the prednisone to help decrease inflammation and aid in pain relief.  You will start this tomorrow morning at breakfast.  You will take it each morning at breakfast for the next 6 days.  Take the baclofen, 10 mg every 8 hours, on a schedule for the next 48 hours and then as needed.  Apply moist heat to your neck/back for 30 minutes at a time 2-3 times a day to improve blood flow to the area and help remove the lactic acid causing the spasm.  Follow the neck/back exercises given at discharge.  If your symptoms do not improve with the steroids, muscle laxer's, and physical therapy you will need to follow-up with the spine center.  I have given the contact information for Dr. Clois, and neurosurgery in the with [facility].  Call the office to make an appointment.     ED Prescriptions     Medication Sig Dispense Auth. Provider   predniSONE (STERAPRED UNI-PAK 21 TAB) 10 MG (21) TBPK tablet Take 6 tablets on day 1, 5 tablets day 2, 4 tablets day 3, 3 tablets day 4, 2 tablets day 5, 1 tablet day 6 21 tablet Bernardino Ditch, NP   baclofen (LIORESAL) 10 MG tablet Take 1 tablet (10 mg total) by mouth 3 (three) times daily as needed for muscle spasms. 30 each Bernardino Ditch, NP      PDMP not reviewed this encounter.   Bernardino Ditch,  NP 10/19/24 9140    Bernardino Ditch, NP 10/19/24 0900

## 2024-10-19 NOTE — Discharge Instructions (Addendum)
 Stop taking the naproxen  and begin taking the prednisone to help decrease inflammation and aid in pain relief.  You will start this tomorrow morning at breakfast.  You will take it each morning at breakfast for the next 6 days.  Take the baclofen, 10 mg every 8 hours, on a schedule for the next 48 hours and then as needed.  Apply moist heat to your neck/back for 30 minutes at a time 2-3 times a day to improve blood flow to the area and help remove the lactic acid causing the spasm.  Follow the neck/back exercises given at discharge.  If your symptoms do not improve with the steroids, muscle laxer's, and physical therapy you will need to follow-up with the spine center.  I have given the contact information for Dr. Clois, and neurosurgery in the with [facility].  Call the office to make an appointment.

## 2024-10-19 NOTE — ED Triage Notes (Addendum)
 Patient states that sx are still going on from last visit. Patient states that they got worse about 10 days ago. Patient states that he's having pain in his neck and back. Right elbow is having pain and point finger has been numb. Patient states that he's been doing stretching that isnt really helping. Tylenol and IBU with no relief. Hot and cold compress. Went to a chiropractor Sunday that did some acupuncture. They instructed patient to come back here and get a steroid.

## 2024-11-10 NOTE — Progress Notes (Unsigned)
 Referring Physician:  Justus Leita DEL, MD [address] [place],  [place] [address]  Primary Physician:  Justus Leita DEL, MD  History of Present Illness: 11/16/2024 [name] has a history of migraines, depression.   Seen by UC on 10/19/24 for neck and right arm pain.   He was given steroid pack by UC on 10/19/24 along with baclofen.   Pain has improved since initial onset on 10/06/24. He has constant neck pain with radiation to right shoulder and shoulder blade. Pain is more dull and aching in nature. No right arm pain, but has pain in right elbow along with numbness in right thumb and index finger. 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 No left arm pain. Not sleeping well due to pain.   He had some massage and dry needling with chiropractor. It did not help.   Not sure if steroids helped.   Tobacco use: Does not smoke.   Bowel/Bladder Dysfunction: none  Conservative measures: massage, dry needling  Physical therapy: has not participated in Multimodal medical therapy including regular antiinflammatories:  Ibuprofen, Naproxen , Baclofen, Prednisone Injections: no epidural steroid injections  Past Surgery: no spine surgery  Ming Wallman has no symptoms of cervical myelopathy.  The symptoms are causing a significant impact on the patient's life.   Review of Systems:  A 10 point review of systems is negative, except for the pertinent positives and negatives detailed in the HPI.  Past Medical History: No past medical history on file.  Past Surgical History: Past Surgical History:  Procedure Laterality Date   TOOTH EXTRACTION     Childhood- 1st grade    Allergies: Allergies as of 11/16/2024   (No Known Allergies)    Medications: Outpatient Encounter Medications as of 11/16/2024  Medication Sig   baclofen (LIORESAL) 10 MG tablet Take 1 tablet (10 mg total) by mouth 3 (three) times daily as needed  for muscle spasms.   carbamazepine  (TEGRETOL ) 200 MG tablet Take 1.5 tablets by mouth daily.   loratadine (CLARITIN) 10 MG tablet Take by mouth.   Multiple Vitamin (MULTIVITAMIN) capsule Take 1 capsule by mouth daily.   [DISCONTINUED] buPROPion  (WELLBUTRIN  XL) 150 MG 24 hr tablet TAKE 1 TABLET(150 MG) BY MOUTH IN THE MORNING AND AT BEDTIME   [DISCONTINUED] predniSONE (STERAPRED UNI-PAK 21 TAB) 10 MG (21) TBPK tablet Take 6 tablets on day 1, 5 tablets day 2, 4 tablets day 3, 3 tablets day 4, 2 tablets day 5, 1 tablet day 6   No facility-administered encounter medications on file as of 11/16/2024.    Social History: Social History   Tobacco Use   Smoking status: Never   Smokeless tobacco: Never  Vaping Use   Vaping status: Former   Substances: Nicotine, Flavoring  Substance Use Topics   Alcohol use: Yes    Comment: Rare    Drug use: Yes    Types: Marijuana    Family Medical History: Family History  Problem Relation Age of Onset   Migraines Mother    Healthy Father    Breast cancer Paternal Grandmother     Physical Examination: Vitals:   11/16/24 0911  BP: (!) 144/96    General: Patient is well developed, well nourished, calm, collected, and in no apparent distress. Attention to examination is appropriate.  Respiratory: Patient is breathing without any difficulty.   NEUROLOGICAL:     Awake, alert, oriented to person, place, and time.  Speech is  clear and fluent. Fund of knowledge is appropriate.   Cranial Nerves: Pupils equal round and reactive to light.  Facial tone is symmetric.    No posterior cervical tenderness. Mild right sided tenderness in trapezial region and into medial scapula.   No abnormal lesions on exposed skin.   Strength: Side Biceps Triceps Deltoid Interossei Grip Wrist Ext. Wrist Flex.  R 5 5 5 5 5 5 5   L 5 5 5 5 5 5 5    Side Iliopsoas Quads Hamstring PF DF EHL  R 5 5 5 5 5 5   L 5 5 5 5 5 5    Reflexes are 2+ and symmetric at the biceps,  brachioradialis, patella and achilles.   Hoffman's is absent.  Clonus is not present.   Bilateral upper and lower extremity sensation is intact to light touch.     Good ROM of both shoulders with no pain.   Gait is normal.     Medical Decision Making  Imaging: none  Assessment and Plan: [name] has 2 month history of constant neck pain with radiation to right shoulder and shoulder blade. No right arm pain, but has pain in right elbow along with numbness in right thumb and index finger. No left arm pain. Not sleeping well due to pain. Pain has improved since initial onset.   No cervical imaging. Some of his pain is likely myofascial as he has tenderness in right trapezial and medial scapula.   Treatment options discussed with patient and following plan made:   - Cervical xrays today on his way out of clinic. Will message him with results.  - Order for physical therapy for cervical spine to [facility]. Patient to call to schedule appointment.  - He can continue prn motrin or tylenol as directed on bottle. Will let me know if he needs something different.  - If no improvement with above, may consider cervical MRI. May also consider EMG/NCS of upper extremities if no improvement with right hand numbness.  - Follow up with me in 6-8 weeks and prn.   I spent a total of 35 minutes in face-to-face and non-face-to-face activities related to this patient's care today including review of outside records, review of imaging, review of symptoms, physical exam, discussion of differential diagnosis, discussion of treatment options, and documentation.   Thank you for involving me in the care of this patient.   Glade Boys PA-C Dept. of Neurosurgery

## 2024-11-16 ENCOUNTER — Ambulatory Visit: Admitting: Orthopedic Surgery

## 2024-11-16 ENCOUNTER — Ambulatory Visit

## 2024-11-16 ENCOUNTER — Encounter: Payer: Self-pay | Admitting: Orthopedic Surgery

## 2024-11-16 VITALS — BP 130/82 | Ht 72.0 in | Wt 159.5 lb

## 2024-11-16 DIAGNOSIS — R200 Anesthesia of skin: Secondary | ICD-10-CM | POA: Diagnosis not present

## 2024-11-16 DIAGNOSIS — M542 Cervicalgia: Secondary | ICD-10-CM

## 2024-11-16 NOTE — Patient Instructions (Signed)
 It was so nice to see you today. Thank you so much for coming in.    I want to get xrays of your neck today. I will message you with results. I think a lot of your pain is muscular in nature.   I sent physical therapy orders to [facility]. You can call them at [phone] to schedule your visit.   If no improvement with above, can consider an MRI of your neck or further nerve testing for your right hand numbness.  I will see you back in 6-8 weeks. Please do not hesitate to call if you have any questions or concerns. You can also message me in MyChart.   Glade Boys PA-C [phone]     The physicians and staff at [facility] at [facility] are committed to providing excellent care. You may receive a survey asking for feedback about your experience at our office. We value you your feedback and appreciate you taking the time to to fill it out. The [facility] leadership team is also available to discuss your experience in person, feel free to contact us  [phone].

## 2024-11-17 ENCOUNTER — Encounter: Payer: Self-pay | Admitting: Orthopedic Surgery

## 2024-11-17 NOTE — Telephone Encounter (Signed)
 Cervical xrays dated 11/17/24:  FINDINGS: 7 non-rib-bearing cervical vertebral bodies.   No visualized hardware.   Normal mineralization.   Straightening of the cervical spine, may be positional.   Maintain alignment on dynamic maneuvers.   Maintained vertebral heights, no fracture.   No significant facet arthrosis.   Preserved disc heights. No significant osteophytes or endplate irregularity.   Soft tissues are unremarkable.   Visualized lungs are clear.   IMPRESSION: No acute fracture or significant cervical spondylosis.     Electronically Signed   By: Curtistine Noble   On: 11/17/2024 15:31   I have personally reviewed the images and agree with the above interpretation.  Message sent to patient.

## 2024-11-23 ENCOUNTER — Ambulatory Visit: Attending: Orthopedic Surgery

## 2024-11-23 DIAGNOSIS — M542 Cervicalgia: Secondary | ICD-10-CM | POA: Diagnosis not present

## 2024-11-23 DIAGNOSIS — M436 Torticollis: Secondary | ICD-10-CM | POA: Diagnosis not present

## 2024-11-23 DIAGNOSIS — M5412 Radiculopathy, cervical region: Secondary | ICD-10-CM | POA: Diagnosis not present

## 2024-11-23 DIAGNOSIS — R200 Anesthesia of skin: Secondary | ICD-10-CM | POA: Diagnosis not present

## 2024-11-23 DIAGNOSIS — R202 Paresthesia of skin: Secondary | ICD-10-CM | POA: Diagnosis not present

## 2024-11-23 NOTE — Therapy (Signed)
 OUTPATIENT PHYSICAL THERAPY CERVICAL EVALUATION 11/23/24   Patient Name: Kent Burke MRN: [id number] DOB:March 01, 2001, 23 y.o., male Today's Date: 11/26/2024  END OF SESSION:  PT End of Session - 11/26/24 1515     Visit Number 1    Number of Visits 17    Date for Recertification  01/18/25    Authorization Type 2x/week x 8 weeks    PT Start Time 1545    PT Stop Time 1630    PT Time Calculation (min) 45 min    Activity Tolerance Patient tolerated treatment well    Behavior During Therapy [facility] for tasks assessed/performed          No past medical history on file. Past Surgical History:  Procedure Laterality Date   TOOTH EXTRACTION     Childhood- 1st grade   Patient Active Problem List   Diagnosis Date Noted   Abnormal involuntary movements 05/04/2020   Major depressive disorder, single episode, moderate degree (HCC) 05/04/2020   Cannabinoid hyperemesis syndrome 05/04/2020   Environmental and seasonal allergies 05/04/2020   Migraine without aura and without status migrainosus, not intractable 05/04/2020   At risk for HIV due to homosexual contact 05/04/2020    PCP: Dr. Justus, MD  REFERRING PROVIDER: Glade Boys, PA-C  REFERRING DIAG:  M54.2 (ICD-10-CM) - Neck pain  R20.0,R20.2 (ICD-10-CM) - Numbness and tingling in right hand    THERAPY DIAG:  Neck stiffness  Radiculopathy, cervical region  Neck pain on right side  Rationale for Evaluation and Treatment: Rehabilitation  ONSET DATE: September 2025  SUBJECTIVE:                                                                                                                                                                                                         SUBJECTIVE STATEMENT: C/o neck pain and R arm tingling/numbness Hand dominance: Right  PERTINENT HISTORY:  Recently Slept wrong and when he woke up had a lot of pain in R UE/pain/numbness (persists) in index finger/thumb area  Initially was  having pain, difficulty sleeping, sx are improving a little- having dull R neck pain and some numbness in fingers  Allev: sleeping on side (R>L), sleeping with pillow supporting neck, sleeping on back, NSAIDs/Tylenol, neutral position for spine, steroid dose pack  Agg: using R UE, holding his arm up at or above shoulder height, barista work, looking up, looking down  Went to urgent care for consult in October (10/22), has seen neurosurgery for consult (11/16/24) and was referred to PT   Did some chiropractic tx 1x- multiple tx/modalities and  did not feel better  PAIN:  Are you having pain? Yes: 2-3/10 at best and 6/10 at worst (used to be 10/10)  PRECAUTIONS: None  RED FLAGS: None     WEIGHT BEARING RESTRICTIONS: No  FALLS:  Has patient fallen in last 6 months? No  LIVING ENVIRONMENT: Lives with: lives with their family Lives in: House/apartment  OCCUPATION: barista (hobbies: higher education careers adviser)  PLOF: Independent  PATIENT GOALS: to learn a personalized routine to improve his neck health/manage sx, relieve pain   NEXT MD VISIT: January 11, 2025  OBJECTIVE:  Note: Objective measures were completed at Evaluation unless otherwise noted.  DIAGNOSTIC FINDINGS:  X-ray of cervical spine 11/20- essentially normal IMPRESSION: No acute fracture or significant cervical spondylosis. Per chart review  PATIENT SURVEYS:  NDI- at future visit  COGNITION: Overall cognitive status: Within functional limits for tasks assessed  SENSATION: WFL  POSTURE: rounded shoulders and forward head  PALPATION: TTP and (+) MTP R UT, lev scap, rhomboids, mid trap- palpation recreated his typical sx in R UE  CERVICAL ROM:   Active ROM A/PROM (deg) eval  Flexion 60  Extension 55  Right lateral flexion 30  Left lateral flexion 30  Right rotation 45  Left rotation 50   (Blank rows = not tested)  UPPER EXTREMITY ROM:  Active ROM Right eval Left eval  Shoulder flexion normal normal  Shoulder  extension    Shoulder abduction    Shoulder adduction    Shoulder extension    Shoulder internal rotation    Shoulder external rotation    Elbow flexion    Elbow extension    Wrist flexion    Wrist extension    Wrist ulnar deviation    Wrist radial deviation    Wrist pronation    Wrist supination     (Blank rows = not tested)  UPPER EXTREMITY MMT:  MMT Right eval Left eval  Shoulder flexion 5 5  Shoulder extension    Shoulder abduction 5 5  Shoulder adduction    Shoulder extension    Shoulder internal rotation    Shoulder external rotation    Middle trapezius 3 3  Lower trapezius    Elbow flexion 4+ 5  Elbow extension    Wrist flexion    Wrist extension 5 5  Wrist ulnar deviation    Wrist radial deviation    Wrist pronation    Wrist supination    Grip strength 121 lbs 88 lbs   (Blank rows = not tested)  CERVICAL SPECIAL TESTS:  Spurling's test: Positive and Distraction test: Positive + R UE ULTT + hypomobility with UPA R C4-7 1+ reflexes UE- biceps, triceps  FUNCTIONAL TESTS:  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- his shoulder is flexed to ~80 and elbow at ~30 deg flex- notes some tingling in R UE  TREATMENT DATE: 11/23/24 Initial evaluation performed  PATIENT EDUCATION:  Education details: PT POC/goals, HEP instruction, posture (self-care) Person educated: Patient Education method: Explanation, Verbal cues, and Handouts Education comprehension: verbalized understanding  HOME EXERCISE PROGRAM: Access Code: [email or web address] URL: [email or web address] Date: 11/23/2024 Prepared by: Vernell Reges  Exercises - Median Nerve Flossing - Tray  - 2 x daily - 7 x weekly - 2 sets - 10 reps - Seated Cervical Rotation AROM  - 2 x daily - 7 x weekly - 10 reps  ASSESSMENT:  CLINICAL IMPRESSION: Patient is a 23 y.o. M who  was seen today for physical therapy evaluation and treatment for R cervical radiculopathy with + MTPs in R upper quadrant contributing to sx.  OBJECTIVE IMPAIRMENTS: decreased activity tolerance, decreased ROM, decreased strength, hypomobility, increased muscle spasms, and pain.   ACTIVITY LIMITATIONS: carrying, lifting, sitting, and reach over head  PARTICIPATION LIMITATIONS: meal prep, cleaning, community activity, and occupation  PERSONAL FACTORS: Time since onset of injury/illness/exacerbation and 1 comorbidity: paroxysmal choreoathetosis/dyskinesia are also affecting patient's functional outcome.   REHAB POTENTIAL: Excellent  CLINICAL DECISION MAKING: Evolving/moderate complexity  EVALUATION COMPLEXITY: Moderate   GOALS: Goals reviewed with patient? 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  Improve neural ULTT R UE to symmetrical to L without reproduction of typical R UE parasthesias Baseline: + at 90 deg elbow flex and full wrist ext R Goal status: INITIAL  3.  Improve postural/periscapular mm strength 1/2 MMT grade to facilitate improved ability to lift/carry/reach without being limited by R UE sx Baseline: 3/5 mid trap Goal status: INITIAL    PLAN:  PT FREQUENCY: 2x/week  PT DURATION: 8 weeks  PLANNED INTERVENTIONS: 97110-Therapeutic exercises, 97530- Therapeutic activity, [id number]- Neuromuscular re-education, 97535- Self Care, 02859- Manual therapy, and Patient/Family education  PLAN FOR NEXT SESSION: manual therapy, HEP  Vernell Reges, PT, DPT, OCS   Vernell FORBES Reges, PT 11/26/2024, 3:16 PM

## 2024-11-28 ENCOUNTER — Ambulatory Visit: Attending: Orthopedic Surgery

## 2024-11-28 DIAGNOSIS — M436 Torticollis: Secondary | ICD-10-CM | POA: Diagnosis not present

## 2024-11-28 DIAGNOSIS — M542 Cervicalgia: Secondary | ICD-10-CM | POA: Insufficient documentation

## 2024-11-28 DIAGNOSIS — M5412 Radiculopathy, cervical region: Secondary | ICD-10-CM | POA: Insufficient documentation

## 2024-11-28 NOTE — Therapy (Signed)
 OUTPATIENT PHYSICAL THERAPY CERVICAL TREATMENT   Patient Name: Kent Burke MRN: [id number] DOB:15-Mar-2001, 23 y.o., male Today's Date: 11/28/2024  END OF SESSION:  PT End of Session - 11/28/24 1505     Visit Number 2    Number of Visits 17    Date for Recertification  01/18/25    Authorization Type 2x/week x 8 weeks    PT Start Time 1505    PT Stop Time 1547    PT Time Calculation (min) 42 min    Activity Tolerance Patient tolerated treatment well    Behavior During Therapy [facility] for tasks assessed/performed          No past medical history on file. Past Surgical History:  Procedure Laterality Date   TOOTH EXTRACTION     Childhood- 1st grade   Patient Active Problem List   Diagnosis Date Noted   Abnormal involuntary movements 05/04/2020   Major depressive disorder, single episode, moderate degree (HCC) 05/04/2020   Cannabinoid hyperemesis syndrome 05/04/2020   Environmental and seasonal allergies 05/04/2020   Migraine without aura and without status migrainosus, not intractable 05/04/2020   At risk for HIV due to homosexual contact 05/04/2020    PCP: Dr. Justus, MD  REFERRING PROVIDER: Glade Boys, PA-C  REFERRING DIAG:  M54.2 (ICD-10-CM) - Neck pain  R20.0,R20.2 (ICD-10-CM) - Numbness and tingling in right hand    THERAPY DIAG:  Neck stiffness  Radiculopathy, cervical region  Neck pain on right side  Rationale for Evaluation and Treatment: Rehabilitation  ONSET DATE: September 2025  SUBJECTIVE:                                                                                                                                                                                                         SUBJECTIVE STATEMENT: C/o neck pain and R arm tingling/numbness Hand dominance: Right  PERTINENT HISTORY:  Recently Slept wrong and when he woke up had a lot of pain in R UE/pain/numbness (persists) in index finger/thumb area  Initially was having pain,  difficulty sleeping, sx are improving a little- having dull R neck pain and some numbness in fingers  Allev: sleeping on side (R>L), sleeping with pillow supporting neck, sleeping on back, NSAIDs/Tylenol, neutral position for spine, steroid dose pack  Agg: using R UE, holding his arm up at or above shoulder height, barista work, looking up, looking down  Went to urgent care for consult in October (10/22), has seen neurosurgery for consult (11/16/24) and was referred to PT   Did some chiropractic tx 1x- multiple tx/modalities and did  not feel better  PAIN:  Are you having pain? Yes: 2-3/10 at best and 6/10 at worst (used to be 10/10)  PRECAUTIONS: None  RED FLAGS: None     WEIGHT BEARING RESTRICTIONS: No  FALLS:  Has patient fallen in last 6 months? No  LIVING ENVIRONMENT: Lives with: lives with their family Lives in: House/apartment  OCCUPATION: barista (hobbies: higher education careers adviser)  PLOF: Independent  PATIENT GOALS: to learn a personalized routine to improve his neck health/manage sx, relieve pain   NEXT MD VISIT: January 11, 2025  OBJECTIVE:  Note: Objective measures were completed at Evaluation unless otherwise noted.  DIAGNOSTIC FINDINGS:  X-ray of cervical spine 11/20- essentially normal IMPRESSION: No acute fracture or significant cervical spondylosis. Per chart review  PATIENT SURVEYS:  NDI- at future visit  COGNITION: Overall cognitive status: Within functional limits for tasks assessed  SENSATION: WFL  POSTURE: rounded shoulders and forward head  PALPATION: TTP and (+) MTP R UT, lev scap, rhomboids, mid trap- palpation recreated his typical sx in R UE  CERVICAL ROM:   Active ROM A/PROM (deg) eval  Flexion 60  Extension 55  Right lateral flexion 30  Left lateral flexion 30  Right rotation 45  Left rotation 50   (Blank rows = not tested)  UPPER EXTREMITY ROM:  Active ROM Right eval Left eval  Shoulder flexion normal normal  Shoulder extension     Shoulder abduction    Shoulder adduction    Shoulder extension    Shoulder internal rotation    Shoulder external rotation    Elbow flexion    Elbow extension    Wrist flexion    Wrist extension    Wrist ulnar deviation    Wrist radial deviation    Wrist pronation    Wrist supination     (Blank rows = not tested)  UPPER EXTREMITY MMT:  MMT Right eval Left eval  Shoulder flexion 5 5  Shoulder extension    Shoulder abduction 5 5  Shoulder adduction    Shoulder extension    Shoulder internal rotation    Shoulder external rotation    Middle trapezius 3 3  Lower trapezius    Elbow flexion 4+ 5  Elbow extension    Wrist flexion    Wrist extension 5 5  Wrist ulnar deviation    Wrist radial deviation    Wrist pronation    Wrist supination    Grip strength 121 lbs 88 lbs   (Blank rows = not tested)  CERVICAL SPECIAL TESTS:  Spurling's test: Positive and Distraction test: Positive + R UE ULTT + hypomobility with UPA R C4-7 1+ reflexes UE- biceps, triceps  FUNCTIONAL TESTS:  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  Sleeping on side more often now.  Lying on side still feels a bit uncomfortable.  Still a little numbness in R index finger area.    Objective: Pain: 1/10  Manual Therapy: Supine cervical spine mob- PPIVM L rotation Gr III/IV C3-7, 30 sec bouts (R opening); sidelying R scapulothoracic mob- all directions, Gr IV, STM/TPR R subscap, rhomboids, mid trap, UT  Therapeutic exercise: ULTT 1- nerve glides with PT in various angles of elbow flex R UE passively x10 reps, 3  sets Seated scapular retraction x 10 Seated cervical spine AROM: rotation R and L, 5x after manual therapy- L rotation improved from min to  unrestricted  Self care: instructed/practiced performing self MTP release with tennis ball standing at wall for R rhomboids, mid trap, upper trap- added to HEP                                                                                                                           PATIENT EDUCATION:  Education details: PT POC/goals, HEP instruction, posture (self-care) Person educated: Patient Education method: Explanation, Verbal cues, and Handouts Education comprehension: verbalized understanding  HOME EXERCISE PROGRAM: Access Code: [email or web address] URL: [email or web address] Date: 11/23/2024 Prepared by: Vernell Reges  Exercises - Median Nerve Flossing - Tray  - 2 x daily - 7 x weekly - 2 sets - 10 reps - Seated Cervical Rotation AROM  - 2 x daily - 7 x weekly - 10 reps  ASSESSMENT:  CLINICAL IMPRESSION: Patient is a 23 y.o. M who was seen today for physical therapy evaluation and treatment for R cervical radiculopathy with + MTPs in R upper quadrant contributing to sx.  Tolerated manual therapy well, cervical spine L rotation improved after cervical spine mobs.  Incorporated self MTP technique into HEP to address MTPs in R periscapular mm.  Demonstrated ability to perform with good form.  Continues to present with + neural tension in R UE.  OBJECTIVE IMPAIRMENTS: decreased activity tolerance, decreased ROM, decreased strength, hypomobility, increased muscle spasms, and pain.   ACTIVITY LIMITATIONS: carrying, lifting, sitting, and reach over head  PARTICIPATION LIMITATIONS: meal prep, cleaning, community activity, and occupation  PERSONAL FACTORS: Time since onset of injury/illness/exacerbation and 1 comorbidity: paroxysmal choreoathetosis/dyskinesia are also affecting patient's functional outcome.   REHAB POTENTIAL: Excellent  CLINICAL DECISION MAKING: Evolving/moderate complexity  EVALUATION COMPLEXITY: Moderate   GOALS: Goals reviewed with patient? 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  Improve neural ULTT R UE to symmetrical to L without reproduction of typical R UE parasthesias Baseline: + at 90 deg elbow flex and full wrist ext R Goal status: INITIAL  3.  Improve postural/periscapular mm strength 1/2 MMT grade to facilitate improved ability to lift/carry/reach without being limited by R UE sx Baseline: 3/5 mid trap Goal status: INITIAL    PLAN:  PT FREQUENCY: 2x/week  PT DURATION: 8 weeks  PLANNED INTERVENTIONS: 97110-Therapeutic exercises, 97530- Therapeutic activity, [id number]- Neuromuscular re-education, 97535- Self Care, [id number]- Manual therapy, and Patient/Family education  PLAN FOR NEXT SESSION: manual therapy, HEP, postural mm retraining  Vernell Reges, PT, DPT, OCS   Vernell FORBES Reges, PT 11/28/2024, 3:05 PM

## 2024-11-28 NOTE — Addendum Note (Signed)
 Addended by: Lasheka Kempner E on: 11/28/2024 01:33 PM   Modules accepted: Orders

## 2024-11-30 ENCOUNTER — Ambulatory Visit

## 2024-11-30 DIAGNOSIS — M436 Torticollis: Secondary | ICD-10-CM

## 2024-11-30 DIAGNOSIS — M542 Cervicalgia: Secondary | ICD-10-CM | POA: Diagnosis not present

## 2024-11-30 DIAGNOSIS — M5412 Radiculopathy, cervical region: Secondary | ICD-10-CM

## 2024-11-30 NOTE — Therapy (Signed)
 OUTPATIENT PHYSICAL THERAPY CERVICAL TREATMENT   Patient Name: Kent Burke MRN: [id number] DOB:2001/04/24, 23 y.o., male Today's Date: 11/30/2024  END OF SESSION:  PT End of Session - 11/30/24 0945     Visit Number 3    Number of Visits 17    Date for Recertification  01/18/25    Authorization Type 2x/week x 8 weeks    PT Start Time 0945    PT Stop Time 1030    PT Time Calculation (min) 45 min    Activity Tolerance Patient tolerated treatment well    Behavior During Therapy [facility] for tasks assessed/performed          No past medical history on file. Past Surgical History:  Procedure Laterality Date   TOOTH EXTRACTION     Childhood- 1st grade   Patient Active Problem List   Diagnosis Date Noted   Abnormal involuntary movements 05/04/2020   Major depressive disorder, single episode, moderate degree (HCC) 05/04/2020   Cannabinoid hyperemesis syndrome 05/04/2020   Environmental and seasonal allergies 05/04/2020   Migraine without aura and without status migrainosus, not intractable 05/04/2020   At risk for HIV due to homosexual contact 05/04/2020    PCP: Dr. Justus, MD  REFERRING PROVIDER: Glade Boys, PA-C  REFERRING DIAG:  M54.2 (ICD-10-CM) - Neck pain  R20.0,R20.2 (ICD-10-CM) - Numbness and tingling in right hand    THERAPY DIAG:  Neck stiffness  Radiculopathy, cervical region  Neck pain on right side  Rationale for Evaluation and Treatment: Rehabilitation  ONSET DATE: September 2025  SUBJECTIVE:                                                                                                                                                                                                         SUBJECTIVE STATEMENT: C/o neck pain and R arm tingling/numbness Hand dominance: Right  PERTINENT HISTORY:  Recently Slept wrong and when he woke up had a lot of pain in R UE/pain/numbness (persists) in index finger/thumb area  Initially was having pain,  difficulty sleeping, sx are improving a little- having dull R neck pain and some numbness in fingers  Allev: sleeping on side (R>L), sleeping with pillow supporting neck, sleeping on back, NSAIDs/Tylenol, neutral position for spine, steroid dose pack  Agg: using R UE, holding his arm up at or above shoulder height, barista work, looking up, looking down  Went to urgent care for consult in October (10/22), has seen neurosurgery for consult (11/16/24) and was referred to PT   Did some chiropractic tx 1x- multiple tx/modalities and did  not feel better  PAIN:  Are you having pain? Yes: 2-3/10 at best and 6/10 at worst (used to be 10/10)  PRECAUTIONS: None  RED FLAGS: None     WEIGHT BEARING RESTRICTIONS: No  FALLS:  Has patient fallen in last 6 months? No  LIVING ENVIRONMENT: Lives with: lives with their family Lives in: House/apartment  OCCUPATION: barista (hobbies: higher education careers adviser)  PLOF: Independent  PATIENT GOALS: to learn a personalized routine to improve his neck health/manage sx, relieve pain   NEXT MD VISIT: January 11, 2025  OBJECTIVE:  Note: Objective measures were completed at Evaluation unless otherwise noted.  DIAGNOSTIC FINDINGS:  X-ray of cervical spine 11/20- essentially normal IMPRESSION: No acute fracture or significant cervical spondylosis. Per chart review  PATIENT SURVEYS:  NDI- at future visit  COGNITION: Overall cognitive status: Within functional limits for tasks assessed  SENSATION: WFL  POSTURE: rounded shoulders and forward head  PALPATION: TTP and (+) MTP R UT, lev scap, rhomboids, mid trap- palpation recreated his typical sx in R UE  CERVICAL ROM:   Active ROM A/PROM (deg) eval  Flexion 60  Extension 55  Right lateral flexion 30  Left lateral flexion 30  Right rotation 45  Left rotation 50   (Blank rows = not tested)  UPPER EXTREMITY ROM:  Active ROM Right eval Left eval  Shoulder flexion normal normal  Shoulder extension     Shoulder abduction    Shoulder adduction    Shoulder extension    Shoulder internal rotation    Shoulder external rotation    Elbow flexion    Elbow extension    Wrist flexion    Wrist extension    Wrist ulnar deviation    Wrist radial deviation    Wrist pronation    Wrist supination     (Blank rows = not tested)  UPPER EXTREMITY MMT:  MMT Right eval Left eval  Shoulder flexion 5 5  Shoulder extension    Shoulder abduction 5 5  Shoulder adduction    Shoulder extension    Shoulder internal rotation    Shoulder external rotation    Middle trapezius 3 3  Lower trapezius    Elbow flexion 4+ 5  Elbow extension    Wrist flexion    Wrist extension 5 5  Wrist ulnar deviation    Wrist radial deviation    Wrist pronation    Wrist supination    Grip strength 121 lbs 88 lbs   (Blank rows = not tested)  CERVICAL SPECIAL TESTS:  Spurling's test: Positive and Distraction test: Positive + R UE ULTT + hypomobility with UPA R C4-7 1+ reflexes UE- biceps, triceps  FUNCTIONAL TESTS:  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    Objective: Pain: 1/10  Manual Therapy: Supine cervical spine mob- PPIVM L rotation Gr III/IV C3-7, 30 sec bouts (R opening); sidelying R scapulothoracic mob- all directions, Gr IV, STM/TPR R subscap, rhomboids, mid trap, UT  Therapeutic exercise: ULTT 1- nerve glides with PT  in various angles of elbow flex R UE passively x10 reps, 3 sets Seated scapular retraction x 10 Seated cervical spine AROM: rotation R  and L, 5x after manual therapy- L rotation improved from min to unrestricted Standing rows: 2x15, green band Standing shoulder extension with scap retraction: 2x15, green band  Self care: instructed/practiced performing self MTP release with tennis ball standing at wall for R rhomboids, mid trap, upper trap- added to HEP- reviewed today                                                                                                                           PATIENT EDUCATION:  Education details: PT POC/goals, HEP instruction, posture (self-care) Person educated: Patient Education method: Explanation, Verbal cues, and Handouts Education comprehension: verbalized understanding  HOME EXERCISE PROGRAM: Access Code: [email or web address] URL: [email or web address] Date: 11/30/2024 Prepared by: Vernell Reges  Exercises - Median Nerve Flossing - Tray  - 2 x daily - 7 x weekly - 2 sets - 10 reps - Seated Cervical Rotation AROM  - 2 x daily - 7 x weekly - 10 reps - Shoulder extension with resistance - Neutral  - 1 x daily - 7 x weekly - 3 sets - 10 reps - Standing Shoulder Row with Anchored Resistance  - 1 x daily - 7 x weekly - 3 sets - 10 reps   ASSESSMENT:  CLINICAL IMPRESSION: Patient is a 23 y.o. M who was seen today for physical therapy evaluation and treatment for R cervical radiculopathy with + MTPs in R upper quadrant contributing to sx.  Tolerated manual therapy well, cervical spine L rotation improved after cervical spine mobs.  Recommended pt continue with self MTP technique into HEP to address MTPs in R periscapular mm.  Able to progress postural mm retraining without c/o increased R UE sx that persisted.  Continues to present with + neural tension in R UE.  Should continue to benefit from skilled PT to address impairments below and facilitate improved ability to perform his daily activities without being limited by R UE pain and parasthesias.  OBJECTIVE IMPAIRMENTS: decreased  activity tolerance, decreased ROM, decreased strength, hypomobility, increased muscle spasms, and pain.   ACTIVITY LIMITATIONS: carrying, lifting, sitting, and reach over head  PARTICIPATION LIMITATIONS: meal prep, cleaning, community activity, and occupation  PERSONAL FACTORS: Time since onset of injury/illness/exacerbation and 1 comorbidity: paroxysmal choreoathetosis/dyskinesia are also affecting patient's functional outcome.   REHAB POTENTIAL: Excellent  CLINICAL DECISION MAKING: Evolving/moderate complexity  EVALUATION COMPLEXITY: Moderate   GOALS: Goals reviewed with patient? 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  Improve neural ULTT R UE to symmetrical to L  without reproduction of typical R UE parasthesias Baseline: + at 90 deg elbow flex and full wrist ext R Goal status: INITIAL  3.  Improve postural/periscapular mm strength 1/2 MMT grade to facilitate improved ability to lift/carry/reach without being limited by R UE sx Baseline: 3/5 mid trap Goal status: INITIAL    PLAN:  PT FREQUENCY: 2x/week  PT DURATION: 8 weeks  PLANNED INTERVENTIONS: 97110-Therapeutic exercises, 97530- Therapeutic activity, [id number]- Neuromuscular re-education, 97535- Self Care, [id number]- Manual therapy, and Patient/Family education  PLAN FOR NEXT SESSION: manual therapy, HEP, postural mm retraining  Vernell Reges, PT, DPT, OCS   Vernell FORBES Reges, PT 11/30/2024, 9:45 AM

## 2024-12-02 ENCOUNTER — Ambulatory Visit

## 2024-12-13 ENCOUNTER — Ambulatory Visit

## 2024-12-20 ENCOUNTER — Ambulatory Visit

## 2024-12-20 DIAGNOSIS — M436 Torticollis: Secondary | ICD-10-CM

## 2024-12-20 DIAGNOSIS — M542 Cervicalgia: Secondary | ICD-10-CM

## 2024-12-20 DIAGNOSIS — M5412 Radiculopathy, cervical region: Secondary | ICD-10-CM | POA: Diagnosis not present

## 2024-12-20 NOTE — Therapy (Signed)
 " OUTPATIENT PHYSICAL THERAPY CERVICAL TREATMENT   Patient Name: Kent Burke MRN: [id number] DOB:2001-08-14, 23 y.o., male Today's Date: 12/20/2024  END OF SESSION:  PT End of Session - 12/20/24 0800     Visit Number 4    Number of Visits 17    Date for Recertification  01/18/25    Authorization Type 2x/week x 8 weeks    PT Start Time 0800    PT Stop Time 0845    PT Time Calculation (min) 45 min    Activity Tolerance Patient tolerated treatment well    Behavior During Therapy [facility] for tasks assessed/performed          No past medical history on file. Past Surgical History:  Procedure Laterality Date   TOOTH EXTRACTION     Childhood- 1st grade   Patient Active Problem List   Diagnosis Date Noted   Abnormal involuntary movements 05/04/2020   Major depressive disorder, single episode, moderate degree (HCC) 05/04/2020   Cannabinoid hyperemesis syndrome 05/04/2020   Environmental and seasonal allergies 05/04/2020   Migraine without aura and without status migrainosus, not intractable 05/04/2020   At risk for HIV due to homosexual contact 05/04/2020    PCP: Dr. Justus, MD  REFERRING PROVIDER: Glade Boys, PA-C  REFERRING DIAG:  M54.2 (ICD-10-CM) - Neck pain  R20.0,R20.2 (ICD-10-CM) - Numbness and tingling in right hand    THERAPY DIAG:  Neck stiffness  Radiculopathy, cervical region  Neck pain on right side  Rationale for Evaluation and Treatment: Rehabilitation  ONSET DATE: September 2025  SUBJECTIVE:                                                                                                                                                                                                         SUBJECTIVE STATEMENT: C/o neck pain and R arm tingling/numbness Hand dominance: Right  PERTINENT HISTORY:  Recently Slept wrong and when he woke up had a lot of pain in R UE/pain/numbness (persists) in index finger/thumb area  Initially was having pain,  difficulty sleeping, sx are improving a little- having dull R neck pain and some numbness in fingers  Allev: sleeping on side (R>L), sleeping with pillow supporting neck, sleeping on back, NSAIDs/Tylenol, neutral position for spine, steroid dose pack  Agg: using R UE, holding his arm up at or above shoulder height, barista work, looking up, looking down  Went to urgent care for consult in October (10/22), has seen neurosurgery for consult (11/16/24) and was referred to PT   Did some chiropractic tx 1x- multiple tx/modalities and  did not feel better  PAIN:  Are you having pain? Yes: 2-3/10 at best and 6/10 at worst (used to be 10/10)  PRECAUTIONS: None  RED FLAGS: None     WEIGHT BEARING RESTRICTIONS: No  FALLS:  Has patient fallen in last 6 months? No  LIVING ENVIRONMENT: Lives with: lives with their family Lives in: House/apartment  OCCUPATION: barista (hobbies: higher education careers adviser)  PLOF: Independent  PATIENT GOALS: to learn a personalized routine to improve his neck health/manage sx, relieve pain   NEXT MD VISIT: January 11, 2025  OBJECTIVE:  Note: Objective measures were completed at Evaluation unless otherwise noted.  DIAGNOSTIC FINDINGS:  X-ray of cervical spine 11/20- essentially normal IMPRESSION: No acute fracture or significant cervical spondylosis. Per chart review  PATIENT SURVEYS:  NDI- at future visit  COGNITION: Overall cognitive status: Within functional limits for tasks assessed  SENSATION: WFL  POSTURE: rounded shoulders and forward head  PALPATION: TTP and (+) MTP R UT, lev scap, rhomboids, mid trap- palpation recreated his typical sx in R UE  CERVICAL ROM:   Active ROM A/PROM (deg) eval  Flexion 60  Extension 55  Right lateral flexion 30  Left lateral flexion 30  Right rotation 45  Left rotation 50   (Blank rows = not tested)  UPPER EXTREMITY ROM:  Active ROM Right eval Left eval  Shoulder flexion normal normal  Shoulder extension     Shoulder abduction    Shoulder adduction    Shoulder extension    Shoulder internal rotation    Shoulder external rotation    Elbow flexion    Elbow extension    Wrist flexion    Wrist extension    Wrist ulnar deviation    Wrist radial deviation    Wrist pronation    Wrist supination     (Blank rows = not tested)  UPPER EXTREMITY MMT:  MMT Right eval Left eval  Shoulder flexion 5 5  Shoulder extension    Shoulder abduction 5 5  Shoulder adduction    Shoulder extension    Shoulder internal rotation    Shoulder external rotation    Middle trapezius 3 3  Lower trapezius    Elbow flexion 4+ 5  Elbow extension    Wrist flexion    Wrist extension 5 5  Wrist ulnar deviation    Wrist radial deviation    Wrist pronation    Wrist supination    Grip strength 121 lbs 88 lbs   (Blank rows = not tested)  CERVICAL SPECIAL TESTS:  Spurling's test: Positive and Distraction test: Positive + R UE ULTT + hypomobility with UPA R C4-7 1+ reflexes UE- biceps, triceps  FUNCTIONAL TESTS:  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  Sleeping is still difficult- lying on R side is the most uncomfortable- cannot even fall asleep on that side.  He is mainly sleeping on his back.  Work has been feeling a little better with his arm.  Less tingling his hand.  Less numbness in the arm too.  Looking down still aggravates R neck a little too.  Sitting with arm propped up is helpful if he is doing a lot of seated tasks.  Would like to continue with his home exercise routine on his own after today.  Objective: Pain: 1/10  Manual Therapy: Supine cervical  spine mob- PPIVM L rotation Gr III/IV C3-7, 30 sec bouts (R opening)  Therapeutic exercise: ULTT 1- nerve glides with PT in various angles of elbow flex R UE passively x10 reps Seated  self tray nerve glide x 10 Seated scapular retraction x 10 Seated cervical spine AROM: assessed today- normal AROM, end range extension and R rotation recreate central neck pain 1/10 Standing rows: 2x15, green band Standing shoulder extension with scap retraction: 2x15, green band Standing scapular retraction with b/l ER: green band 2x10 Standing cervical retraction: at wall 2x10  Self care: instructed/practiced updated HEP; pt education-discussed continuing to focus on improving posture of head/neck/upper quadrant to neutral spine position for daily activities; discussed importance of continuing with postural mm retraining/strengthening and neural mobilization.  Discussed strategies to promote HEP adherence for long term.                                                                                                                             PATIENT EDUCATION:  Education details: PT POC/goals, HEP instruction, posture (self-care) Person educated: Patient Education method: Explanation, Verbal cues, and Handouts Education comprehension: verbalized understanding  HOME EXERCISE PROGRAM: Access Code: [email or web address] URL: [email or web address] Date: 12/20/2024 Prepared by: Vernell Reges  Exercises - Median Nerve Flossing - Tray  - 2 x daily - 7 x weekly - 2 sets - 10 reps - Seated Cervical Rotation AROM  - 2 x daily - 7 x weekly - 10 reps - Shoulder extension with resistance - Neutral  - 1 x daily - 7 x weekly - 3 sets - 10 reps - Standing Shoulder Row with Anchored Resistance  - 1 x daily - 7 x weekly - 3 sets - 10 reps - Shoulder External Rotation and Scapular Retraction with Resistance  - 1 x daily - 7 x weekly - 3 sets - 10 reps - Cervical Retraction at Wall  - 1 x daily - 7 x weekly - 3 sets - 10 reps - 5 hold  ASSESSMENT:  CLINICAL IMPRESSION: Patient is a 23 y.o. M who was seen today for physical therapy evaluation and treatment for R cervical radiculopathy with + MTPs  in R upper quadrant contributing to sx.  Overall, he is making excellent progress with PT; he states he would prefer to continue with a HEP independently today.  His cervical spine AROM has improved; has end range sx but ROM is full. Continues to present with + neural tension in R UE.  MTP's in R postural mm have resolved.  Still lacks normal strength/endurance/motor control in postural mm.  Focused on therapeutic exercises for those mm, PT incorporated tactile/verbal cues for exercises during session.  Progressed HEP and pt demonstrates and verbalizes understanding.  Should continue to benefit from working on HEP for long term optimal outcome and ability to perform daily activities without being limited by neck pain/R UE parasthesias.  OBJECTIVE IMPAIRMENTS: decreased activity tolerance, decreased ROM, decreased strength, hypomobility, increased muscle spasms,  and pain.   ACTIVITY LIMITATIONS: carrying, lifting, sitting, and reach over head  PARTICIPATION LIMITATIONS: meal prep, cleaning, community activity, and occupation  PERSONAL FACTORS: Time since onset of injury/illness/exacerbation and 1 comorbidity: paroxysmal choreoathetosis/dyskinesia are also affecting patient's functional outcome.   REHAB POTENTIAL: Excellent  CLINICAL DECISION MAKING: Evolving/moderate complexity  EVALUATION COMPLEXITY: Moderate   GOALS: Goals reviewed with patient? 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  Improve neural ULTT R UE to symmetrical to L without reproduction of typical R UE parasthesias Baseline: + at 90 deg elbow flex and full wrist ext R Goal status: INITIAL  3.  Improve  postural/periscapular mm strength 1/2 MMT grade to facilitate improved ability to lift/carry/reach without being limited by R UE sx Baseline: 3/5 mid trap Goal status: INITIAL    PLAN:  PT FREQUENCY: 2x/week  PT DURATION: 8 weeks  PLANNED INTERVENTIONS: 97110-Therapeutic exercises, 97530- Therapeutic activity, [id number]- Neuromuscular re-education, 97535- Self Care, 02859- Manual therapy, and Patient/Family education  PLAN FOR NEXT SESSION: pt to continue working on HEP until f/u with MD in January.  If no return to PT occurs then plan to DC at that point.  Vernell Reges, PT, DPT, OCS   Vernell FORBES Reges, PT 12/20/2024, 8:00 AM      "

## 2024-12-26 ENCOUNTER — Ambulatory Visit

## 2024-12-28 ENCOUNTER — Ambulatory Visit

## 2025-01-02 ENCOUNTER — Ambulatory Visit

## 2025-01-04 ENCOUNTER — Ambulatory Visit

## 2025-01-06 ENCOUNTER — Ambulatory Visit

## 2025-01-08 NOTE — Progress Notes (Unsigned)
 "  Referring Physician:  Justus Leita DEL, MD [address] [place],  [place] [address]  Primary Physician:  Kent Leita DEL, MD  History of Present Illness: [name] has a history of migraines, depression.   Last seen by me on 11/16/24 for constant neck pain with right shoulder pain. I felt some of his pain was likely myofascial at his last visit. His cervical xrays looked good.   He ws sent to PT- he had initial evaluation on 11/23/24 and did 3 more visits with last on 12/20/24.   He is here for follow up.   He is doing much better! He has been doing his HEP from PT, but has not been doing them lately.   He has only intermittent neck pain, more like a twinge. He still has some pain when laying on right side. 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 He takes prn motrin and rarely baclofen .   He notes intermittent numbness in second and third toe on left foot that started a few days ago.   Not sure if steroids helped.   Tobacco use: Does not smoke.   Bowel/Bladder Dysfunction: none  Conservative measures: massage, dry needling  Physical therapy: initial evaluation on 11/23/24 and did 3 more visits with last on 12/20/24.  Multimodal medical therapy including regular antiinflammatories:  Ibuprofen, Naproxen , Baclofen , Prednisone  Injections: no epidural steroid injections  Past Surgery: no spine surgery  Kent Burke has no symptoms of cervical myelopathy.  The symptoms are causing a significant impact on the patient's life.   Review of Systems:  A 10 point review of systems is negative, except for the pertinent positives and negatives detailed in the HPI.  Past Medical History: No past medical history on file.  Past Surgical History: Past Surgical History:  Procedure Laterality Date   TOOTH EXTRACTION     Childhood- 1st grade    Allergies: Allergies as of 01/11/2025   (No Known Allergies)    Medications: Outpatient Encounter  Medications as of 01/11/2025  Medication Sig   baclofen  (LIORESAL ) 10 MG tablet Take 1 tablet (10 mg total) by mouth 3 (three) times daily as needed for muscle spasms.   carbamazepine  (TEGRETOL ) 200 MG tablet Take 1.5 tablets by mouth daily.   loratadine (CLARITIN) 10 MG tablet Take by mouth.   Multiple Vitamin (MULTIVITAMIN) capsule Take 1 capsule by mouth daily.   No facility-administered encounter medications on file as of 01/11/2025.    Social History: Social History   Tobacco Use   Smoking status: Never   Smokeless tobacco: Never  Vaping Use   Vaping status: Former   Substances: Nicotine, Flavoring  Substance Use Topics   Alcohol use: Yes    Comment: Rare    Drug use: Yes    Types: Marijuana    Family Medical History: Family History  Problem Relation Age of Onset   Migraines Mother    Healthy Father    Breast cancer Paternal Grandmother     Physical Examination: Vitals:   01/11/25 0930  BP: 122/78      Awake, alert, oriented to person, place, and time.  Speech is clear and fluent. Fund of knowledge is appropriate.   Cranial Nerves: Pupils equal round and reactive to light.  Facial tone is symmetric.    No posterior cervical tenderness. Mild right sided tenderness in trapezial region and into medial scapula.   No abnormal lesions on exposed skin.   Strength: Side Biceps Triceps Deltoid Interossei Grip  Wrist Ext. Wrist Flex.  R 5 5 5 5 5 5 5   L 5 5 5 5 5 5 5     Bilateral upper extremity sensation is intact to light touch.     Gait is normal.     Medical Decision Making  Imaging: none  Assessment and Plan: Mr. Bramble has improved with PT.  He has only intermittent neck pain, more like a twinge. He still has some pain when laying on right side. 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 No numbness, tingling, or weakness in his arms.   Previous cervical xrays looked good.  Treatment options discussed with patient and following plan  made:   - He will restart HEP from PT.  - He can follow up with me prn.   Of note, he's had a few days of intermittent numbness in second and third toe on left foot. He will watch this for now.   I spent a total of 15 minutes in face-to-face and non-face-to-face activities related to this patient's care today including review of outside records, review of imaging, review of symptoms, physical exam, discussion of differential diagnosis, discussion of treatment options, and documentation.   Kent Boys PA-C Dept. of Neurosurgery  "

## 2025-01-09 ENCOUNTER — Ambulatory Visit

## 2025-01-11 ENCOUNTER — Encounter: Payer: Self-pay | Admitting: Orthopedic Surgery

## 2025-01-11 ENCOUNTER — Ambulatory Visit: Admitting: Orthopedic Surgery

## 2025-01-11 VITALS — BP 122/78 | Ht 72.0 in | Wt 159.0 lb

## 2025-01-11 DIAGNOSIS — M542 Cervicalgia: Secondary | ICD-10-CM | POA: Diagnosis not present

## 2025-01-11 DIAGNOSIS — R200 Anesthesia of skin: Secondary | ICD-10-CM | POA: Diagnosis not present

## 2025-01-13 ENCOUNTER — Ambulatory Visit
